# Patient Record
Sex: Male | Born: 1950
Health system: Southern US, Community
[De-identification: ages and names within clinical notes are randomized; demographics above are authoritative.]

## PROBLEM LIST (undated history)

## (undated) DIAGNOSIS — E7800 Pure hypercholesterolemia, unspecified: Secondary | ICD-10-CM

## (undated) DIAGNOSIS — J189 Pneumonia, unspecified organism: Secondary | ICD-10-CM

## (undated) DIAGNOSIS — C801 Malignant (primary) neoplasm, unspecified: Secondary | ICD-10-CM

## (undated) DIAGNOSIS — K5792 Diverticulitis of intestine, part unspecified, without perforation or abscess without bleeding: Secondary | ICD-10-CM

## (undated) DIAGNOSIS — C61 Malignant neoplasm of prostate: Secondary | ICD-10-CM

## (undated) DIAGNOSIS — J61 Pneumoconiosis due to asbestos and other mineral fibers: Secondary | ICD-10-CM

## (undated) DIAGNOSIS — G459 Transient cerebral ischemic attack, unspecified: Secondary | ICD-10-CM

## (undated) DIAGNOSIS — I4891 Unspecified atrial fibrillation: Secondary | ICD-10-CM

## (undated) DIAGNOSIS — H538 Other visual disturbances: Secondary | ICD-10-CM

## (undated) DIAGNOSIS — R972 Elevated prostate specific antigen [PSA]: Secondary | ICD-10-CM

## (undated) DIAGNOSIS — H547 Unspecified visual loss: Secondary | ICD-10-CM

## (undated) DIAGNOSIS — F329 Major depressive disorder, single episode, unspecified: Secondary | ICD-10-CM

## (undated) DIAGNOSIS — Z8739 Personal history of other diseases of the musculoskeletal system and connective tissue: Secondary | ICD-10-CM

## (undated) DIAGNOSIS — R002 Palpitations: Secondary | ICD-10-CM

## (undated) DIAGNOSIS — K219 Gastro-esophageal reflux disease without esophagitis: Secondary | ICD-10-CM

## (undated) DIAGNOSIS — R21 Rash and other nonspecific skin eruption: Secondary | ICD-10-CM

## (undated) DIAGNOSIS — D229 Melanocytic nevi, unspecified: Secondary | ICD-10-CM

## (undated) DIAGNOSIS — K5732 Diverticulitis of large intestine without perforation or abscess without bleeding: Secondary | ICD-10-CM

## (undated) DIAGNOSIS — K635 Polyp of colon: Secondary | ICD-10-CM

## (undated) DIAGNOSIS — M5090 Cervical disc disorder, unspecified, unspecified cervical region: Secondary | ICD-10-CM

## (undated) HISTORY — DX: Polyp of colon: K63.5

## (undated) HISTORY — DX: Diverticulitis of large intestine without perforation or abscess without bleeding: K57.32

## (undated) HISTORY — DX: Elevated prostate specific antigen (PSA): R97.20

## (undated) HISTORY — DX: Other visual disturbances: H53.8

## (undated) HISTORY — PX: HERNIA REPAIR: SHX51

## (undated) HISTORY — DX: Transient cerebral ischemic attack, unspecified: G45.9

## (undated) HISTORY — DX: Malignant neoplasm of prostate: C61

## (undated) HISTORY — DX: Diverticulitis of intestine, part unspecified, without perforation or abscess without bleeding: K57.92

## (undated) HISTORY — DX: Pure hypercholesterolemia, unspecified: E78.00

## (undated) HISTORY — DX: Palpitations: R00.2

## (undated) HISTORY — DX: Major depressive disorder, single episode, unspecified: F32.9

## (undated) HISTORY — DX: Unspecified atrial fibrillation: I48.91

## (undated) HISTORY — DX: Cervical disc disorder, unspecified, unspecified cervical region: M50.90

## (undated) HISTORY — DX: Gastro-esophageal reflux disease without esophagitis: K21.9

## (undated) HISTORY — DX: Rash and other nonspecific skin eruption: R21

## (undated) HISTORY — DX: Personal history of other diseases of the musculoskeletal system and connective tissue: Z87.39

## (undated) HISTORY — DX: Unspecified visual loss: H54.7

## (undated) HISTORY — DX: Pneumoconiosis due to asbestos and other mineral fibers: J61

## (undated) HISTORY — PX: OTHER SURGICAL HISTORY: SHX169

## (undated) HISTORY — PX: PROSTATE BIOPSY: SHX241

---
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
## 1995-09-07 DIAGNOSIS — J189 Pneumonia, unspecified organism: Secondary | ICD-10-CM

## 1995-09-07 HISTORY — DX: Pneumonia, unspecified organism: J18.9

## 2000-09-06 DIAGNOSIS — F32A Depression, unspecified: Secondary | ICD-10-CM

## 2000-09-06 HISTORY — DX: Depression, unspecified: F32.A

## 2002-09-04 ENCOUNTER — Ambulatory Visit (HOSPITAL_COMMUNITY): Admission: RE | Admit: 2002-09-04 | Discharge: 2002-09-04 | Payer: Self-pay | Admitting: Cardiology

## 2005-10-29 ENCOUNTER — Ambulatory Visit (HOSPITAL_COMMUNITY): Admission: RE | Admit: 2005-10-29 | Discharge: 2005-10-29 | Payer: Self-pay | Admitting: Internal Medicine

## 2005-10-29 ENCOUNTER — Ambulatory Visit: Payer: Self-pay | Admitting: Internal Medicine

## 2011-06-22 ENCOUNTER — Telehealth (INDEPENDENT_AMBULATORY_CARE_PROVIDER_SITE_OTHER): Payer: Self-pay | Admitting: *Deleted

## 2011-06-22 ENCOUNTER — Other Ambulatory Visit (INDEPENDENT_AMBULATORY_CARE_PROVIDER_SITE_OTHER): Payer: Self-pay | Admitting: *Deleted

## 2011-06-22 ENCOUNTER — Encounter (INDEPENDENT_AMBULATORY_CARE_PROVIDER_SITE_OTHER): Payer: Self-pay | Admitting: *Deleted

## 2011-06-22 ENCOUNTER — Encounter (INDEPENDENT_AMBULATORY_CARE_PROVIDER_SITE_OTHER): Payer: Self-pay | Admitting: Internal Medicine

## 2011-06-22 ENCOUNTER — Ambulatory Visit (INDEPENDENT_AMBULATORY_CARE_PROVIDER_SITE_OTHER): Payer: 59 | Admitting: Internal Medicine

## 2011-06-22 DIAGNOSIS — Z800 Family history of malignant neoplasm of digestive organs: Secondary | ICD-10-CM

## 2011-06-22 DIAGNOSIS — Z86010 Personal history of colonic polyps: Secondary | ICD-10-CM

## 2011-06-22 DIAGNOSIS — K5792 Diverticulitis of intestine, part unspecified, without perforation or abscess without bleeding: Secondary | ICD-10-CM

## 2011-06-22 DIAGNOSIS — Z1211 Encounter for screening for malignant neoplasm of colon: Secondary | ICD-10-CM

## 2011-06-22 DIAGNOSIS — K5732 Diverticulitis of large intestine without perforation or abscess without bleeding: Secondary | ICD-10-CM

## 2011-06-22 NOTE — Telephone Encounter (Signed)
Patient need movi prep 

## 2011-06-22 NOTE — Progress Notes (Signed)
Subjective:     Patient ID: Blake Moss, male   DOB: November 03, 1950, 60 y.o.   MRN: [id number]  HPI [name] is a 60 yrs old male here today for surveillance colonoscopy.   His last colonoscopy was in 10/29/2005.  Distal proctitis which would explain the patient's intermittent hematochezia and rectal discomfort.  Biopsy: inflamed focally adenomatous polyp.  He has undergone 3 other colonoscopies in the pat, all of which were performed by Dr. Clearence Cheek of [place], [place]. Polyps were found on the first exam, but the subsequent two exams were negative. He tell me he is just getting over a bout of diverticulitis.  He said his symptoms started 5 weeks a go.  He was running a fever and had left lower quadrant pain.  Even with Motrin he could not get his fever down to 100.4. Seen by PCP 4 weeks and was put on Cipro and Flagyl for 10 days.  He does feel better but he has not got his strength back.  This was his second bout of diverticulitis. Appetite is good. No weight loss.  No abdominal pain. No melena or bright red rectal bleeding.   There is a family history of colon cancer.   Review of Systems  See hpi     Past Medical History  Diagnosis Date  . Diverticulitis   . GERD (gastroesophageal reflux disease)    Past Surgical History  Procedure Date  . Footl surgery a    as a child  . Hernia repair     left inguinal 4 yrs ago   History reviewed. No pertinent family history. Family Status  Relation Status Death Age  . Mother Deceased     CHF  . Father Deceased     colon cancer at age 59yrs old  . Brother Alive     joint and back problems  . Child Alive     good health   History   Social History Narrative  . No narrative on file   History   Social History  . Marital Status: Married    Spouse Name: N/A    Number of Children: N/A  . Years of Education: N/A   Occupational History  . Not on file.   Social History Main Topics  . Smoking status: Never Smoker   . Smokeless tobacco:  Not on file  . Alcohol Use: Yes     rarely  . Drug Use: No  . Sexually Active: Not on file   Other Topics Concern  . Not on file   Social History Narrative  . No narrative on file   Allergies  Allergen Reactions  . Penicillins     Objective:   Physical Exam  Filed Vitals:   06/22/11 1012  BP: 110/78  Pulse: 72  Temp: 98.6 F (37 C)  Height: 5\' 11"  (1.803 m)  Weight: 165 lb 12.8 oz (75.206 kg)    Alert and oriented. Skin warm and dry. Oral mucosa is moist. Natural teeth in good condition. Sclera anicteric, conjunctivae is pink. Thyroid not enlarged. No cervical lymphadenopathy. Lungs clear. Heart regular rate and rhythm.  Abdomen is soft. Bowel sounds are positive. No hepatomegaly. No abdominal masses felt. No tenderness.  No edema to lower extremities. Patient is alert and oriented.      Assessment:     High risk colonoscopy. Family hx of colon cancer.   Recent hx of Diverticulitis Plan:     Colonoscopy  . The risks and benefits such as perforation,  bleeding, and infection were reviewed with the patient and is agreeable.  After colonoscopy, patient would also liked to be referred to talk with a surgeon concerning his recent bout of diverticulitis.

## 2011-06-22 NOTE — Patient Instructions (Signed)
Will schedule a colonoscopy with Dr. Rehman 

## 2011-06-23 MED ORDER — PEG-KCL-NACL-NASULF-NA ASC-C 100 G PO SOLR: 1.0000 | Freq: Once | ORAL | Status: DC

## 2011-07-13 ENCOUNTER — Encounter (HOSPITAL_COMMUNITY): Payer: Self-pay | Admitting: Pharmacy Technician

## 2011-07-22 MED ORDER — SODIUM CHLORIDE 0.45 % IV SOLN
Freq: Once | INTRAVENOUS | Status: AC
  Administered 2011-07-23: 1000 mL via INTRAVENOUS

## 2011-07-23 ENCOUNTER — Encounter (HOSPITAL_COMMUNITY): Payer: Self-pay | Admitting: *Deleted

## 2011-07-23 ENCOUNTER — Ambulatory Visit (HOSPITAL_COMMUNITY)
Admission: RE | Admit: 2011-07-23 | Discharge: 2011-07-23 | Disposition: A | Discharge status: Home / Self Care | Payer: 59 | Source: Ambulatory Visit | Attending: Internal Medicine | Admitting: Internal Medicine

## 2011-07-23 ENCOUNTER — Encounter (HOSPITAL_COMMUNITY)
Admission: RE | Disposition: A | Discharge status: Home / Self Care | Payer: Self-pay | Source: Ambulatory Visit | Attending: Internal Medicine

## 2011-07-23 DIAGNOSIS — K5730 Diverticulosis of large intestine without perforation or abscess without bleeding: Secondary | ICD-10-CM

## 2011-07-23 DIAGNOSIS — Z800 Family history of malignant neoplasm of digestive organs: Secondary | ICD-10-CM | POA: Insufficient documentation

## 2011-07-23 DIAGNOSIS — Z8719 Personal history of other diseases of the digestive system: Secondary | ICD-10-CM

## 2011-07-23 DIAGNOSIS — K644 Residual hemorrhoidal skin tags: Secondary | ICD-10-CM

## 2011-07-23 DIAGNOSIS — Z09 Encounter for follow-up examination after completed treatment for conditions other than malignant neoplasm: Secondary | ICD-10-CM

## 2011-07-23 DIAGNOSIS — Z860100 Personal history of colon polyps, unspecified: Secondary | ICD-10-CM | POA: Insufficient documentation

## 2011-07-23 DIAGNOSIS — R1031 Right lower quadrant pain: Secondary | ICD-10-CM | POA: Insufficient documentation

## 2011-07-23 HISTORY — PX: COLONOSCOPY: SHX5424

## 2011-07-23 SURGERY — COLONOSCOPY
Anesthesia: Moderate Sedation

## 2011-07-23 MED ORDER — MIDAZOLAM HCL 5 MG/5ML IJ SOLN
INTRAMUSCULAR | Status: AC
  Filled 2011-07-23: qty 10

## 2011-07-23 MED ORDER — STERILE WATER FOR IRRIGATION IR SOLN
Status: DC | PRN
  Administered 2011-07-23: 08:00:00

## 2011-07-23 MED ORDER — MEPERIDINE HCL 50 MG/ML IJ SOLN
INTRAMUSCULAR | Status: DC | PRN
  Administered 2011-07-23 (×2): 25 mg via INTRAVENOUS

## 2011-07-23 MED ORDER — MIDAZOLAM HCL 5 MG/5ML IJ SOLN
INTRAMUSCULAR | Status: DC | PRN
  Administered 2011-07-23 (×3): 2 mg via INTRAVENOUS

## 2011-07-23 MED ORDER — HYOSCYAMINE SULFATE 0.125 MG SL SUBL: 0.1250 mg | SUBLINGUAL_TABLET | Freq: Three times a day (TID) | SUBLINGUAL | Status: AC | PRN

## 2011-07-23 MED ORDER — MEPERIDINE HCL 50 MG/ML IJ SOLN
INTRAMUSCULAR | Status: AC
  Filled 2011-07-23: qty 1

## 2011-07-23 NOTE — H&P (Signed)
Blake Moss is an 60 y.o. male.   Chief Complaint: Patient is here for colonoscopy. HPI: This 60 year old Caucasian male with history of colonic polyps. She's undergone multiple colonoscopies in the past. His last exam was over 5 years ago. Polyps removed in 3 colonoscopies. He is complaining of pain in right lower quadrant. About 2 months ago he was treated for diverticulitis when he had fever and pain in left lower quadrant. He noted stools are dark but not quite black when he was treated with antibiotics. Family history is positive for colon  carcinoma in his father and maternal grandmother. Father was diagnosed at age 8 and died within a year grandmother died in her 21s. Past Medical History  Diagnosis Date  . Diverticulitis   . GERD (gastroesophageal reflux disease)     Past Surgical History  Procedure Date  . Footl surgery a    as a child  . Hernia repair     left inguinal 4 yrs ago    Family History  Problem Relation Age of Onset  . Coronary artery disease Mother   . Colon cancer Father   . Lupus Father    Social History:  reports that he has never smoked. He does not have any smokeless tobacco history on file. He reports that he drinks alcohol. He reports that he does not use illicit drugs.  Allergies:  Allergies  Allergen Reactions  . Penicillins     Medications Prior to Admission  Medication Dose Route Frequency Provider Last Rate Last Dose  . 0.45 % sodium chloride infusion   Intravenous Once Malissa Hippo, MD 20 mL/hr at 07/23/11 0722 1,000 mL at 07/23/11 0722  . meperidine (DEMEROL) 50 MG/ML injection           . midazolam (VERSED) 5 MG/5ML injection            Medications Prior to Admission  Medication Sig Dispense Refill  . peg 3350 powder (MOVIPREP) 100 G SOLR Take 1 kit (100 g total) by mouth once.  1 kit  0    No results found for this or any previous visit (from the past 48 hour(s)). No results found.  Review of Systems  Constitutional:  Negative for weight loss.  Gastrointestinal: Negative for diarrhea, constipation, blood in stool and melena. Abdominal pain: intermittent pain in right lower quadrant.    Blood pressure 136/99, pulse 95, temperature 97.8 F (36.6 C), temperature source Oral, resp. rate 20, height 5\' 11"  (1.803 m), weight 165 lb (74.844 kg), SpO2 92.00%. Physical Exam  Constitutional: He appears well-developed and well-nourished.  HENT:  Mouth/Throat: Oropharynx is clear and moist.  Eyes: Conjunctivae are normal. No scleral icterus.  Neck: No thyromegaly present.  Cardiovascular: Normal rate, regular rhythm and normal heart sounds.   No murmur heard. Respiratory: Effort normal and breath sounds normal.  GI: Soft. He exhibits no distension and no mass. There is no tenderness.  Musculoskeletal: He exhibits no edema.  Lymphadenopathy:    He has no cervical adenopathy.  Neurological: He is alert.  Skin: Skin is warm and dry.     Assessment/Plan History of colonic polyps. Family history of colon carcinoma. Surveillance colonoscopy  Deiona Hooper U 07/23/2011, 7:38 AM

## 2011-07-23 NOTE — Op Note (Signed)
COLONOSCOPY PROCEDURE REPORT  PATIENT:  Blake Moss  MR#:  [id number] Birthdate:  1950-12-26, 60 y.o., male Endoscopist:  Dr. Malissa Hippo, MD Referred By:  Dr. Kirstie Peri, M.D. Procedure Date: 07/23/2011  Procedure:   Colonoscopy  Indications:  Patient is 60 year old Caucasian male with history of colonic polyps family history of colon carcinoma to about 8 weeks ago he was treated for was felt to be diverticulitis. Left-sided abdominal pain has resolved but he still has pain at RLQ.  Informed Consent:  Seizure and risks were reviewed with the patient and informed consent was obtained.  Medications:  Demerol 50 mg IV Versed 6 mg IV  Description of procedure:  After a digital rectal exam was performed, that colonoscope was advanced from the anus through the rectum and colon to the area of the cecum, ileocecal valve and appendiceal orifice. The cecum was deeply intubated. These structures were well-seen and photographed for the record. From the level of the cecum and ileocecal valve, the scope was slowly and cautiously withdrawn. The mucosal surfaces were carefully surveyed utilizing scope tip to flexion to facilitate fold flattening as needed. The scope was pulled down into the rectum where a thorough exam including retroflexion was performed.  Findings:   Prep excellent. Moderate number of diverticula at sigmoid colon. No polyps or other mucosal abnormalities noted. Hemorrhoids below the dentate line.  Therapeutic/Diagnostic Maneuvers Performed:  None  Complications:  None  Cecal Withdrawal Time:  11 minutes  Impression:  Examination performed to cecum. No evidence of recurrent polyps or colitis. Moderate sigmoid diverticulosis. Small external hemorrhoids.  Recommendations:  Standard instructions given. Levsin sublingual one tablet 3 times a day as needed for abdominal pain. Next colonoscopy in 5 years.   REHMAN,NAJEEB U  07/23/2011 8:07 AM  CC: Dr. Kirstie Peri,  MD & Dr. Bonnetta Barry ref. provider found

## 2011-08-02 ENCOUNTER — Encounter (HOSPITAL_COMMUNITY): Payer: Self-pay | Admitting: Internal Medicine

## 2012-02-04 ENCOUNTER — Encounter (INDEPENDENT_AMBULATORY_CARE_PROVIDER_SITE_OTHER): Payer: Self-pay

## 2013-03-19 DIAGNOSIS — D229 Melanocytic nevi, unspecified: Secondary | ICD-10-CM

## 2013-03-19 HISTORY — DX: Melanocytic nevi, unspecified: D22.9

## 2015-10-29 ENCOUNTER — Other Ambulatory Visit: Payer: Self-pay | Admitting: Urology

## 2015-11-14 NOTE — Patient Instructions (Addendum)
Blake Moss  11/14/2015   Your procedure is scheduled on: Monday 11/24/2015  Report to [facility] Main  Entrance take [place]  elevators to 3rd floor to  [place] at  0930 AM.  Call this number if you have problems the morning of surgery [phone]   Remember: ONLY 1 PERSON MAY GO WITH YOU TO SHORT STAY TO GET  READY MORNING OF [place].   Do not eat food or drink liquids :After Midnight on Saturday 11/22/2015!               FOLLOW BOWEL PREP INSTRUCTIONS FROM DR. BORDEN'S OFFICE ON Sunday 11/23/2015 AND FOLLOW A CLEAR LIQUID DIET ON THAT DAY UP TIL MIDNIGHT!   CLEAR LIQUID DIET   Foods Allowed                                                                     Foods Excluded  Coffee and tea, regular and decaf                             liquids that you cannot  Plain Jell-O in any flavor                                             see through such as: Fruit ices (not with fruit pulp)                                     milk, soups, orange juice  Iced Popsicles                                    All solid food Carbonated beverages, regular and diet                                    Cranberry, grape and apple juices Sports drinks like Gatorade Lightly seasoned clear broth or consume(fat free) Sugar, honey syrup  Sample Menu Breakfast                                Lunch                                     Supper Cranberry juice                    Beef broth                            Chicken broth Jell-O  Grape juice                           Apple juice Coffee or tea                        Jell-O                                      Popsicle                                                Coffee or tea                        Coffee or tea  _____________________________________________________________________     Take these medicines the morning of surgery with A SIP OF WATER: Omeprazole                        You may not have any metal on your body including hair pins and              piercings  Do not wear jewelry, make-up, lotions, powders or perfumes, deodorant             Do not wear nail polish.  Do not shave  48 hours prior to surgery.              Men may shave face and neck.   Do not bring valuables to the hospital. [place].  Contacts, dentures or bridgework may not be worn into surgery.  Leave suitcase in the car. After surgery it may be brought to your room.      Please read over the following fact sheets you were given: _____________________________________________________________________             [facility] - Preparing for Surgery Before surgery, you can play an important role.  Because skin is not sterile, your skin needs to be as free of germs as possible.  You can reduce the number of germs on your skin by washing with CHG (chlorahexidine gluconate) soap before surgery.  CHG is an antiseptic cleaner which kills germs and bonds with the skin to continue killing germs even after washing. Please DO NOT use if you have an allergy to CHG or antibacterial soaps.  If your skin becomes reddened/irritated stop using the CHG and inform your nurse when you arrive at Short Stay. Do not shave (including legs and underarms) for at least 48 hours prior to the first CHG shower.  You may shave your face/neck. Please follow these instructions carefully:  1.  Shower with CHG Soap the night before surgery and the  morning of Surgery.  2.  If you choose to wash your hair, wash your hair first as usual with your  normal  shampoo.  3.  After you shampoo, rinse your hair and body thoroughly to remove the  shampoo.                           4.  Use CHG as you would any other liquid soap.  You can apply chg directly  to the skin and wash                       Gently with a scrungie or clean washcloth.  5.  Apply the CHG Soap to  your body ONLY FROM THE NECK DOWN.   Do not use on face/ open                           Wound or open sores. Avoid contact with eyes, ears mouth and genitals (private parts).                       Wash face,  Genitals (private parts) with your normal soap.             6.  Wash thoroughly, paying special attention to the area where your surgery  will be performed.  7.  Thoroughly rinse your body with warm water from the neck down.  8.  DO NOT shower/wash with your normal soap after using and rinsing off  the CHG Soap.                9.  Pat yourself dry with a clean towel.            10.  Wear clean pajamas.            11.  Place clean sheets on your bed the night of your first shower and do not  sleep with pets. Day of Surgery : Do not apply any lotions/deodorants the morning of surgery.  Please wear clean clothes to the hospital/surgery center.  FAILURE TO FOLLOW THESE INSTRUCTIONS MAY RESULT IN THE CANCELLATION OF YOUR SURGERY PATIENT SIGNATURE_________________________________  NURSE SIGNATURE__________________________________  ________________________________________________________________________   Blake Moss  An incentive spirometer is a tool that can help keep your lungs clear and active. This tool measures how well you are filling your lungs with each breath. Taking long deep breaths may help reverse or decrease the chance of developing breathing (pulmonary) problems (especially infection) following:  A long period of time when you are unable to move or be active. BEFORE THE PROCEDURE   If the spirometer includes an indicator to show your best effort, your nurse or respiratory therapist will set it to a desired goal.  If possible, sit up straight or lean slightly forward. Try not to slouch.  Hold the incentive spirometer in an upright position. INSTRUCTIONS FOR USE   Sit on the edge of your bed if possible, or sit up as far as you can in bed or on a chair.  Hold  the incentive spirometer in an upright position.  Breathe out normally.  Place the mouthpiece in your mouth and seal your lips tightly around it.  Breathe in slowly and as deeply as possible, raising the piston or the ball toward the top of the column.  Hold your breath for 3-5 seconds or for as long as possible. Allow the piston or ball to fall to the bottom of the column.  Remove the mouthpiece from your mouth and breathe out normally.  Rest for a few seconds and repeat Steps 1 through 7 at least 10 times every 1-2 hours when you are awake. Take your time and take a few normal breaths between deep breaths.  The spirometer may include an indicator to show your  best effort. Use the indicator as a goal to work toward during each repetition.  After each set of 10 deep breaths, practice coughing to be sure your lungs are clear. If you have an incision (the cut made at the time of surgery), support your incision when coughing by placing a pillow or rolled up towels firmly against it. Once you are able to get out of bed, walk around indoors and cough well. You may stop using the incentive spirometer when instructed by your caregiver.  RISKS AND COMPLICATIONS  Take your time so you do not get dizzy or light-headed.  If you are in pain, you may need to take or ask for pain medication before doing incentive spirometry. It is harder to take a deep breath if you are having pain. AFTER USE  Rest and breathe slowly and easily.  It can be helpful to keep track of a log of your progress. Your caregiver can provide you with a simple table to help with this. If you are using the spirometer at home, follow these instructions: [place] IF:   You are having difficultly using the spirometer.  You have trouble using the spirometer as often as instructed.  Your pain medication is not giving enough relief while using the spirometer.  You develop fever of 100.5 F (38.1 C) or higher. SEEK  IMMEDIATE MEDICAL CARE IF:   You cough up bloody sputum that had not been present before.  You develop fever of 102 F (38.9 C) or greater.  You develop worsening pain at or near the incision site. MAKE SURE YOU:   Understand these instructions.  Will watch your condition.  Will get help right away if you are not doing well or get worse. Document Released: 01/03/2007 Document Revised: 11/15/2011 Document Reviewed: 03/06/2007 ExitCare Patient Information 2014 ExitCare, [place].   ________________________________________________________________________  WHAT IS A BLOOD TRANSFUSION? Blood Transfusion Information  A transfusion is the replacement of blood or some of its parts. Blood is made up of multiple cells which provide different functions.  Red blood cells carry oxygen and are used for blood loss replacement.  White blood cells fight against infection.  Platelets control bleeding.  Plasma helps clot blood.  Other blood products are available for specialized needs, such as hemophilia or other clotting disorders. BEFORE THE TRANSFUSION  Who gives blood for transfusions?   Healthy volunteers who are fully evaluated to make sure their blood is safe. This is blood bank blood. Transfusion therapy is the safest it has ever been in the practice of medicine. Before blood is taken from a donor, a complete history is taken to make sure that person has no history of diseases nor engages in risky social behavior (examples are intravenous drug use or sexual activity with multiple partners). The donor's travel history is screened to minimize risk of transmitting infections, such as malaria. The donated blood is tested for signs of infectious diseases, such as HIV and hepatitis. The blood is then tested to be sure it is compatible with you in order to minimize the chance of a transfusion reaction. If you or a relative donates blood, this is often done in anticipation of surgery and is not  appropriate for emergency situations. It takes many days to process the donated blood. RISKS AND COMPLICATIONS Although transfusion therapy is very safe and saves many lives, the main dangers of transfusion include:   Getting an infectious disease.  Developing a transfusion reaction. This is an allergic reaction to something  in the blood you were given. Every precaution is taken to prevent this. The decision to have a blood transfusion has been considered carefully by your caregiver before blood is given. Blood is not given unless the benefits outweigh the risks. AFTER THE TRANSFUSION  Right after receiving a blood transfusion, you will usually feel much better and more energetic. This is especially true if your red blood cells have gotten low (anemic). The transfusion raises the level of the red blood cells which carry oxygen, and this usually causes an energy increase.  The nurse administering the transfusion will monitor you carefully for complications. HOME CARE INSTRUCTIONS  No special instructions are needed after a transfusion. You may find your energy is better. Speak with your caregiver about any limitations on activity for underlying diseases you may have. SEEK MEDICAL CARE IF:   Your condition is not improving after your transfusion.  You develop redness or irritation at the intravenous (IV) site. SEEK IMMEDIATE MEDICAL CARE IF:  Any of the following symptoms occur over the next 12 hours:  Shaking chills.  You have a temperature by mouth above 102 F (38.9 C), not controlled by medicine.  Chest, back, or muscle pain.  People around you feel you are not acting correctly or are confused.  Shortness of breath or difficulty breathing.  Dizziness and fainting.  You get a rash or develop hives.  You have a decrease in urine output.  Your urine turns a dark color or changes to pink, red, or brown. Any of the following symptoms occur over the next 10 days:  You have a  temperature by mouth above 102 F (38.9 C), not controlled by medicine.  Shortness of breath.  Weakness after normal activity.  The white part of the eye turns yellow (jaundice).  You have a decrease in the amount of urine or are urinating less often.  Your urine turns a dark color or changes to pink, red, or brown. Document Released: 08/20/2000 Document Revised: 11/15/2011 Document Reviewed: 04/08/2008 [facility] Patient Information 2014 [place], [place].  _______________________________________________________________________

## 2015-11-17 ENCOUNTER — Ambulatory Visit (HOSPITAL_COMMUNITY)
Admission: RE | Admit: 2015-11-17 | Discharge: 2015-11-17 | Disposition: A | Discharge status: Home / Self Care | Payer: 59 | Source: Ambulatory Visit | Attending: Anesthesiology | Admitting: Anesthesiology

## 2015-11-17 ENCOUNTER — Encounter (HOSPITAL_COMMUNITY): Payer: Self-pay

## 2015-11-17 ENCOUNTER — Encounter (HOSPITAL_COMMUNITY)
Admission: RE | Admit: 2015-11-17 | Discharge: 2015-11-17 | Disposition: A | Discharge status: Home / Self Care | Payer: 59 | Source: Ambulatory Visit | Attending: Urology | Admitting: Urology

## 2015-11-17 DIAGNOSIS — Z8546 Personal history of malignant neoplasm of prostate: Secondary | ICD-10-CM | POA: Diagnosis present

## 2015-11-17 DIAGNOSIS — Z01818 Encounter for other preprocedural examination: Secondary | ICD-10-CM | POA: Diagnosis not present

## 2015-11-17 HISTORY — DX: Pneumonia, unspecified organism: J18.9

## 2015-11-17 HISTORY — DX: Malignant (primary) neoplasm, unspecified: C80.1

## 2015-11-17 LAB — CBC
HCT: 48.3 % (ref 39.0–52.0)
Hemoglobin: 16.1 g/dL (ref 13.0–17.0)
MCH: 31.7 pg (ref 26.0–34.0)
MCHC: 33.3 g/dL (ref 30.0–36.0)
MCV: 95.1 fL (ref 78.0–100.0)
PLATELETS: 233 10*3/uL (ref 150–400)
RBC: 5.08 MIL/uL (ref 4.22–5.81)
RDW: 14.3 % (ref 11.5–15.5)
WBC: 5.9 10*3/uL (ref 4.0–10.5)

## 2015-11-17 LAB — BASIC METABOLIC PANEL
Anion gap: 8 (ref 5–15)
BUN: 13 mg/dL (ref 6–20)
CALCIUM: 9.6 mg/dL (ref 8.9–10.3)
CO2: 24 mmol/L (ref 22–32)
CREATININE: 0.9 mg/dL (ref 0.61–1.24)
Chloride: 108 mmol/L (ref 101–111)
GFR calc non Af Amer: >60 mL/min (ref >60)
Glucose, Bld: 112 mg/dL — ABNORMAL HIGH (ref 65–99)
Potassium: 4.3 mmol/L (ref 3.5–5.1)
SODIUM: 140 mmol/L (ref 135–145)

## 2015-11-17 LAB — ABO/RH: ABO/RH(D): A NEG

## 2015-11-17 NOTE — Progress Notes (Addendum)
10/27/2015-Last office note from Dr. Monico Blitz 08/16/2002-@ D ECHO from [facility] on chart. 08/15/2012-2 view chest from [place], [place] on chart .04/24/2006-EKG on chart from [facility] on chart.

## 2015-11-18 NOTE — Progress Notes (Signed)
Consulted Anesthesia, Dr. Suella Broad about EKG . Dr. Smith Robert wants to look at most recent Echocardiogram. PCP office called for Echo results.

## 2015-11-18 NOTE — Progress Notes (Addendum)
09/22/2015-Echo from [facility] on chart.Marland Kitchen Consulted Anesthesia, Dr. Suella Broad about results of Echo . Per Dr. Smith Robert, patient is OK for surgery.

## 2015-11-21 NOTE — H&P (Signed)
Chief Complaint Prostate Cancer   Reason For Visit Reason for consult: To discuss treatment options for prostate cancer.  Physician requesting consult: Dr. Clyde Lundborg  PCP: Dr. Monico Blitz   History of Present Illness Blake Moss is a 66 year old gentleman who was noted to have an elevated PSA of 10.0. He has a history of prostatitis and although he did not have any clinical symptoms to suggest acute prostatitis, he was empirically treated with ciprofloxacin for 3 weeks. A repeat PSA remained elevated at 14.2. This ultimately prompted a TRUS biopsy of the prostate by Dr. Exie Parody on 09/30/15 that revealed Gleason 4+4=8 adenocarcinoma of the prostate. Staging studies were performed including a bone scan (10/16/15) that demonstrated mild uptake at the right cervical spine, bilateral knees, and the left ankle felt to be most consistent with degenerative changes and a CT scan of the pelvis (10/14/15) that did not demonstrated lymphadenopathy or other findings to suggest metastatic disease. He has maternal grandfather with a history of prostate cancer.    ** His PMH is significant for GERD and an open inguinal hernia repair.    TNM stage: cT1c N0 M0  PSA: 14.2  Gleason score: 4+4=8  Biopsy (09/30/15- read by Dr. Mark Martinique, Acc# [phone]): Left apex (40% of 1 core, 3+3 = 6), left mid (90% of 1 core, 4+4=8), left base (10% of 1 core, 4+4 = 8), right base (60% of one core and 75% of another core, 4+3 = 7)  Prostate volume: 20.2 cc      Nomogram  OC disease: 14%  EPE: 83%  SVI: 35%  LNI: 36%  PFS (surgery): 26% at 5 years, 16% at 10 years    Urinary function: He has mild lower urinary tract symptoms. IPSS is 5. The symptoms are not bothersome for him.  Erectile function: SHIM score is 1. He is not sexually active and erectile function as a low priority considering his wife's health issues related to Crohn's disease.   Past Medical History Problems  1. History of esophageal  reflux (Z87.19)  Surgical History Problems  1. History of Hernia Repair  Current Meds 1. No Reported Medications Recorded  Allergies Medication  1. Penicillins  Family History Problems  1. Family history of colon cancer (Z80.0) : Father 2. Family history of prostate cancer (Z80.42) : Maternal Grandfather 3. Family history of Urinary calculus : Mother  Social History Problems    Denied: History of Alcohol use   Married   Never a smoker  Review of Systems Genitourinary, constitutional, skin, eye, otolaryngeal, hematologic/lymphatic, cardiovascular, pulmonary, endocrine, musculoskeletal, gastrointestinal, neurological and psychiatric system(s) were reviewed and pertinent findings if present are noted and are otherwise negative.  Gastrointestinal: heartburn.  Integumentary: skin rash/lesion and pruritus.  Eyes: blurred vision.  Respiratory: cough.  Musculoskeletal: joint pain.    Vitals Vital Signs [Data Includes: Last 1 Day]  Recorded: 21Feb2017 09:00AM  Blood Pressure: 138 / 92 Heart Rate: 84  Physical Exam Constitutional: Well nourished and well developed . No acute distress.  ENT:. The ears and nose are normal in appearance.  Neck: The appearance of the neck is normal and no neck mass is present.  Pulmonary: No respiratory distress, normal respiratory rhythm and effort and clear bilateral breath sounds.  Cardiovascular: Heart rate and rhythm are normal . No peripheral edema.  Abdomen: The abdomen is soft and nontender. No masses are palpated. No CVA tenderness. No hernias are palpable. No hepatosplenomegaly noted. 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  Rectal: Prostate size is estimated to be 25 g. He does have some mild induration toward the right lateral mid prostate gland. It is difficult to tell if this is related to his recent biopsy. No definite extraprostatic  disease is noted.  Lymphatics: The femoral and inguinal nodes are not enlarged or tender.  Skin: Normal skin turgor, no visible rash and no visible skin lesions.  Neuro/Psych:. Mood and affect are appropriate.    Results/Data I have independently reviewed his medical records, PSA results, and pathology report. I independently reviewed his bone scan and CT scan. Findings are as dictated above.     Assessment Assessed  1. Prostate cancer (C61)  Plan Prostate cancer  1. Follow-up Office  Follow-up  Status: Hold For - Date of Service  Requested for:  21Feb2017 2. PT/OT Referral Referral  Referral  Status: Hold For - PreCert,Date of Service,Physical  Therapy  Requested for: [id number]  Discussion/Summary 1. High risk localized prostate cancer: I had a detailed discussion with Blake Moss. He is very well informed about his treatment options. He does wish to proceed with therapy of curative intent and after reviewing options, he is most interested in a surgical approach. He does understand the potential risks for necessitating multimodality therapy considering the high risk nature of his disease.   The patient was counseled about the natural history of prostate cancer and the standard treatment options that are available for prostate cancer. It was explained to him how his age and life expectancy, clinical stage, Gleason score, and PSA affect his prognosis, the decision to proceed with additional staging studies, as well as how that information influences recommended treatment strategies. We discussed the roles for active surveillance, radiation therapy, surgical therapy, androgen deprivation, as well as ablative therapy options for the treatment of prostate cancer as appropriate to his individual cancer situation. We discussed the risks and benefits of these options with regard to their impact on cancer control and also in terms of potential adverse events, complications, and impact on quiality of  life particularly related to urinary, bowel, and sexual function. The patient was encouraged to ask questions throughout the discussion today and all questions were answered to his stated satisfaction. In addition, the patient was provided with and/or directed to appropriate resources and literature for further education about prostate cancer and treatment options.   We discussed surgical therapy for prostate cancer including the different available surgical approaches. We discussed, in detail, the risks and expectations of surgery with regard to cancer control, urinary control, and erectile function as well as the expected postoperative recovery process. Additional risks of surgery including but not limited to bleeding, infection, hernia formation, nerve damage, lymphocele formation, bowel/rectal injury potentially necessitating colostomy, damage to the urinary tract resulting in urine leakage, urethral stricture, and the cardiopulmonary risks such as myocardial infarction, stroke, death, venothromboembolism, etc. were explained. The risk of open surgical conversion for robotic/laparoscopic prostatectomy was also discussed.     He will be scheduled for a robot-assisted laparoscopic radical prostatectomy and bilateral pelvic lymphadenectomy. Although he does not necessarily require a wide excision of the prostate, he understands that we will not make an attempt for an aggressive nerve sparing procedure either considering the fact that he places sexual function as a very low priority especially in the context of his high risk.    Cc: Dr. Clyde Lundborg  Dr. Monico Blitz  A total of 65 minutes were spent in the overall care of the patient today with 55 minutes in direct face to face consultation.    Signatures Electronically signed by : Raynelle Bring, M.D.; Oct 28 2015 11:21AM EST

## 2015-11-24 ENCOUNTER — Inpatient Hospital Stay (HOSPITAL_COMMUNITY): Payer: 59 | Admitting: Certified Registered"

## 2015-11-24 ENCOUNTER — Inpatient Hospital Stay (HOSPITAL_COMMUNITY)
Admission: RE | Admit: 2015-11-24 | Discharge: 2015-11-25 | DRG: 708 | Disposition: A | Discharge status: Home / Self Care | Payer: 59 | Source: Ambulatory Visit | Attending: Urology | Admitting: Urology

## 2015-11-24 ENCOUNTER — Encounter (HOSPITAL_COMMUNITY)
Admission: RE | Disposition: A | Discharge status: Home / Self Care | Payer: Self-pay | Source: Ambulatory Visit | Attending: Urology

## 2015-11-24 ENCOUNTER — Encounter (HOSPITAL_COMMUNITY): Payer: Self-pay | Admitting: *Deleted

## 2015-11-24 DIAGNOSIS — C61 Malignant neoplasm of prostate: Principal | ICD-10-CM | POA: Diagnosis present

## 2015-11-24 DIAGNOSIS — Z800 Family history of malignant neoplasm of digestive organs: Secondary | ICD-10-CM

## 2015-11-24 DIAGNOSIS — K219 Gastro-esophageal reflux disease without esophagitis: Secondary | ICD-10-CM | POA: Diagnosis present

## 2015-11-24 DIAGNOSIS — Z8042 Family history of malignant neoplasm of prostate: Secondary | ICD-10-CM | POA: Diagnosis not present

## 2015-11-24 HISTORY — PX: ROBOT ASSISTED LAPAROSCOPIC RADICAL PROSTATECTOMY: SHX5141

## 2015-11-24 HISTORY — PX: LYMPHADENECTOMY: SHX5960

## 2015-11-24 LAB — TYPE AND SCREEN
ABO/RH(D): A NEG
Antibody Screen: NEGATIVE

## 2015-11-24 LAB — HEMOGLOBIN AND HEMATOCRIT, BLOOD
HCT: 43.4 % (ref 39.0–52.0)
Hemoglobin: 14.5 g/dL (ref 13.0–17.0)

## 2015-11-24 SURGERY — XI ROBOTIC ASSISTED LAPAROSCOPIC RADICAL PROSTATECTOMY LEVEL 2
Anesthesia: General

## 2015-11-24 MED ORDER — PHENYLEPHRINE 40 MCG/ML (10ML) SYRINGE FOR IV PUSH (FOR BLOOD PRESSURE SUPPORT)
PREFILLED_SYRINGE | INTRAVENOUS | Status: AC
  Filled 2015-11-24: qty 20

## 2015-11-24 MED ORDER — HYDROCODONE-ACETAMINOPHEN 5-325 MG PO TABS: 1.0000 | ORAL_TABLET | Freq: Four times a day (QID) | ORAL | Status: DC | PRN

## 2015-11-24 MED ORDER — MIDAZOLAM HCL 5 MG/5ML IJ SOLN
INTRAMUSCULAR | Status: DC | PRN
  Administered 2015-11-24: 2 mg via INTRAVENOUS

## 2015-11-24 MED ORDER — LACTATED RINGERS IV SOLN
INTRAVENOUS | Status: DC | PRN
  Administered 2015-11-24 (×3): via INTRAVENOUS

## 2015-11-24 MED ORDER — HYDROMORPHONE HCL 1 MG/ML IJ SOLN
INTRAMUSCULAR | Status: AC
  Filled 2015-11-24: qty 1

## 2015-11-24 MED ORDER — PANTOPRAZOLE SODIUM 40 MG PO TBEC
40.0000 mg | DELAYED_RELEASE_TABLET | Freq: Every day | ORAL | Status: DC
  Administered 2015-11-25: 40 mg via ORAL
  Filled 2015-11-24 (×2): qty 1

## 2015-11-24 MED ORDER — FENTANYL CITRATE (PF) 250 MCG/5ML IJ SOLN
INTRAMUSCULAR | Status: AC
  Filled 2015-11-24: qty 5

## 2015-11-24 MED ORDER — FENTANYL CITRATE (PF) 100 MCG/2ML IJ SOLN
INTRAMUSCULAR | Status: DC | PRN
  Administered 2015-11-24: 50 ug via INTRAVENOUS
  Administered 2015-11-24: 100 ug via INTRAVENOUS
  Administered 2015-11-24 (×2): 50 ug via INTRAVENOUS

## 2015-11-24 MED ORDER — INFLUENZA VAC SPLIT QUAD 0.5 ML IM SUSY
0.5000 mL | PREFILLED_SYRINGE | INTRAMUSCULAR | Status: AC
  Administered 2015-11-25: 0.5 mL via INTRAMUSCULAR
  Filled 2015-11-24 (×2): qty 0.5

## 2015-11-24 MED ORDER — SODIUM CHLORIDE 0.9 % IR SOLN
Status: DC | PRN
  Administered 2015-11-24: 1000 mL

## 2015-11-24 MED ORDER — CEFAZOLIN SODIUM-DEXTROSE 2-3 GM-% IV SOLR
INTRAVENOUS | Status: AC
Start: 2015-11-24 — End: 2015-11-24
  Filled 2015-11-24: qty 50

## 2015-11-24 MED ORDER — DEXAMETHASONE SODIUM PHOSPHATE 10 MG/ML IJ SOLN
INTRAMUSCULAR | Status: AC
  Filled 2015-11-24: qty 1

## 2015-11-24 MED ORDER — MIDAZOLAM HCL 2 MG/2ML IJ SOLN
INTRAMUSCULAR | Status: AC
Start: 2015-11-24 — End: 2015-11-24
  Filled 2015-11-24: qty 2

## 2015-11-24 MED ORDER — HEPARIN SODIUM (PORCINE) 1000 UNIT/ML IJ SOLN
INTRAMUSCULAR | Status: AC
Start: 2015-11-24 — End: 2015-11-24
  Filled 2015-11-24: qty 1

## 2015-11-24 MED ORDER — LACTATED RINGERS IV SOLN
INTRAVENOUS | Status: DC | PRN
  Administered 2015-11-24: 12:00:00

## 2015-11-24 MED ORDER — ROCURONIUM BROMIDE 100 MG/10ML IV SOLN
INTRAVENOUS | Status: DC | PRN
  Administered 2015-11-24: 60 mg via INTRAVENOUS
  Administered 2015-11-24 (×2): 20 mg via INTRAVENOUS

## 2015-11-24 MED ORDER — FENTANYL CITRATE (PF) 100 MCG/2ML IJ SOLN: 25.0000 ug | INTRAMUSCULAR | Status: DC | PRN

## 2015-11-24 MED ORDER — BUPIVACAINE-EPINEPHRINE 0.25% -1:200000 IJ SOLN
INTRAMUSCULAR | Status: DC | PRN
  Administered 2015-11-24: 30 mL

## 2015-11-24 MED ORDER — ONDANSETRON HCL 4 MG/2ML IJ SOLN
4.0000 mg | INTRAMUSCULAR | Status: DC | PRN
  Administered 2015-11-24 – 2015-11-25 (×4): 4 mg via INTRAVENOUS
  Filled 2015-11-24 (×4): qty 2

## 2015-11-24 MED ORDER — DOCUSATE SODIUM 100 MG PO CAPS
100.0000 mg | ORAL_CAPSULE | Freq: Two times a day (BID) | ORAL | Status: DC
Start: 2015-11-24 — End: 2015-11-25
  Administered 2015-11-24 – 2015-11-25 (×2): 100 mg via ORAL
  Filled 2015-11-24 (×2): qty 1

## 2015-11-24 MED ORDER — SUGAMMADEX SODIUM 200 MG/2ML IV SOLN
INTRAVENOUS | Status: DC | PRN
  Administered 2015-11-24: 150 mg via INTRAVENOUS

## 2015-11-24 MED ORDER — DIPHENHYDRAMINE HCL 12.5 MG/5ML PO ELIX: 12.5000 mg | ORAL_SOLUTION | Freq: Four times a day (QID) | ORAL | Status: DC | PRN

## 2015-11-24 MED ORDER — LIDOCAINE HCL (CARDIAC) 20 MG/ML IV SOLN
INTRAVENOUS | Status: DC | PRN
  Administered 2015-11-24: 80 mg via INTRAVENOUS

## 2015-11-24 MED ORDER — SULFAMETHOXAZOLE-TRIMETHOPRIM 800-160 MG PO TABS: 1.0000 | ORAL_TABLET | Freq: Two times a day (BID) | ORAL | Status: DC

## 2015-11-24 MED ORDER — PHENYLEPHRINE HCL 10 MG/ML IJ SOLN
INTRAMUSCULAR | Status: DC | PRN
  Administered 2015-11-24: 40 ug via INTRAVENOUS
  Administered 2015-11-24: 80 ug via INTRAVENOUS
  Administered 2015-11-24: 120 ug via INTRAVENOUS
  Administered 2015-11-24 (×2): 80 ug via INTRAVENOUS

## 2015-11-24 MED ORDER — SODIUM CHLORIDE 0.9 % IV BOLUS (SEPSIS)
1000.0000 mL | Freq: Once | INTRAVENOUS | Status: AC
  Administered 2015-11-24: 1000 mL via INTRAVENOUS

## 2015-11-24 MED ORDER — ROCURONIUM BROMIDE 100 MG/10ML IV SOLN
INTRAVENOUS | Status: AC
Start: 2015-11-24 — End: 2015-11-24
  Filled 2015-11-24: qty 1

## 2015-11-24 MED ORDER — MORPHINE SULFATE (PF) 2 MG/ML IV SOLN
2.0000 mg | INTRAVENOUS | Status: DC | PRN
  Administered 2015-11-24 – 2015-11-25 (×4): 2 mg via INTRAVENOUS
  Filled 2015-11-24 (×4): qty 1

## 2015-11-24 MED ORDER — KETOROLAC TROMETHAMINE 15 MG/ML IJ SOLN
INTRAMUSCULAR | Status: AC
  Filled 2015-11-24: qty 1

## 2015-11-24 MED ORDER — HYDROMORPHONE HCL 1 MG/ML IJ SOLN: 0.2500 mg | INTRAMUSCULAR | Status: DC | PRN

## 2015-11-24 MED ORDER — SUGAMMADEX SODIUM 200 MG/2ML IV SOLN
INTRAVENOUS | Status: AC
Start: 2015-11-24 — End: 2015-11-24
  Filled 2015-11-24: qty 2

## 2015-11-24 MED ORDER — CETYLPYRIDINIUM CHLORIDE 0.05 % MT LIQD
7.0000 mL | Freq: Two times a day (BID) | OROMUCOSAL | Status: DC
  Administered 2015-11-24 – 2015-11-25 (×2): 7 mL via OROMUCOSAL

## 2015-11-24 MED ORDER — BELLADONNA ALKALOIDS-OPIUM 16.2-60 MG RE SUPP
1.0000 | Freq: Four times a day (QID) | RECTAL | Status: DC | PRN
  Administered 2015-11-24 – 2015-11-25 (×2): 1 via RECTAL
  Filled 2015-11-24 (×2): qty 1

## 2015-11-24 MED ORDER — KCL IN DEXTROSE-NACL 20-5-0.45 MEQ/L-%-% IV SOLN
INTRAVENOUS | Status: DC
  Administered 2015-11-24 – 2015-11-25 (×2): via INTRAVENOUS
  Filled 2015-11-24 (×4): qty 1000

## 2015-11-24 MED ORDER — KETOROLAC TROMETHAMINE 15 MG/ML IJ SOLN
15.0000 mg | Freq: Four times a day (QID) | INTRAMUSCULAR | Status: DC
  Administered 2015-11-24 – 2015-11-25 (×4): 15 mg via INTRAVENOUS
  Filled 2015-11-24 (×5): qty 1

## 2015-11-24 MED ORDER — DEXAMETHASONE SODIUM PHOSPHATE 10 MG/ML IJ SOLN
INTRAMUSCULAR | Status: DC | PRN
  Administered 2015-11-24: 5 mg via INTRAVENOUS

## 2015-11-24 MED ORDER — LIDOCAINE HCL (CARDIAC) 20 MG/ML IV SOLN
INTRAVENOUS | Status: AC
  Filled 2015-11-24: qty 5

## 2015-11-24 MED ORDER — ACETAMINOPHEN 160 MG/5ML PO SOLN
975.0000 mg | Freq: Once | ORAL | Status: AC
  Administered 2015-11-24: 975 mg via ORAL
  Filled 2015-11-24: qty 40.6

## 2015-11-24 MED ORDER — ONDANSETRON HCL 4 MG/2ML IJ SOLN
INTRAMUSCULAR | Status: AC
  Filled 2015-11-24: qty 4

## 2015-11-24 MED ORDER — ONDANSETRON HCL 4 MG/2ML IJ SOLN
INTRAMUSCULAR | Status: DC | PRN
  Administered 2015-11-24 (×4): 2 mg via INTRAVENOUS

## 2015-11-24 MED ORDER — CEFAZOLIN SODIUM-DEXTROSE 2-3 GM-% IV SOLR
2.0000 g | INTRAVENOUS | Status: AC
  Administered 2015-11-24: 2 g via INTRAVENOUS

## 2015-11-24 MED ORDER — HYDROMORPHONE HCL 1 MG/ML IJ SOLN
0.2500 mg | INTRAMUSCULAR | Status: DC | PRN
  Administered 2015-11-24 (×5): 0.5 mg via INTRAVENOUS

## 2015-11-24 MED ORDER — DIPHENHYDRAMINE HCL 50 MG/ML IJ SOLN
12.5000 mg | Freq: Four times a day (QID) | INTRAMUSCULAR | Status: DC | PRN
Start: 2015-11-24 — End: 2015-11-25

## 2015-11-24 MED ORDER — CEFAZOLIN SODIUM 1-5 GM-% IV SOLN
1.0000 g | Freq: Three times a day (TID) | INTRAVENOUS | Status: AC
Start: 2015-11-24 — End: 2015-11-25
  Administered 2015-11-24 – 2015-11-25 (×2): 1 g via INTRAVENOUS
  Filled 2015-11-24 (×2): qty 50

## 2015-11-24 MED ORDER — BUPIVACAINE-EPINEPHRINE (PF) 0.25% -1:200000 IJ SOLN
INTRAMUSCULAR | Status: AC
  Filled 2015-11-24: qty 30

## 2015-11-24 MED ORDER — ACETAMINOPHEN 325 MG PO TABS: 650.0000 mg | ORAL_TABLET | ORAL | Status: DC | PRN

## 2015-11-24 MED ORDER — PROPOFOL 10 MG/ML IV BOLUS
INTRAVENOUS | Status: AC
  Filled 2015-11-24: qty 20

## 2015-11-24 MED ORDER — PROPOFOL 10 MG/ML IV BOLUS
INTRAVENOUS | Status: DC | PRN
  Administered 2015-11-24: 180 mg via INTRAVENOUS

## 2015-11-24 SURGICAL SUPPLY — 51 items
APPLICATOR COTTON TIP 6IN STRL (MISCELLANEOUS) ×4 IMPLANT
CATH FOLEY 2WAY SLVR 18FR 30CC (CATHETERS) ×4 IMPLANT
CATH ROBINSON RED A/P 16FR (CATHETERS) ×4 IMPLANT
CATH ROBINSON RED A/P 8FR (CATHETERS) ×4 IMPLANT
CATH TIEMANN FOLEY 18FR 5CC (CATHETERS) ×4 IMPLANT
CHLORAPREP W/TINT 26ML (MISCELLANEOUS) ×4 IMPLANT
CLIP LIGATING HEM O LOK PURPLE (MISCELLANEOUS) ×8 IMPLANT
COVER SURGICAL LIGHT HANDLE (MISCELLANEOUS) ×4 IMPLANT
COVER TIP SHEARS 8 DVNC (MISCELLANEOUS) ×2 IMPLANT
COVER TIP SHEARS 8MM DA VINCI (MISCELLANEOUS) ×2
CUTTER ECHEON FLEX ENDO 45 340 (ENDOMECHANICALS) ×4 IMPLANT
DECANTER SPIKE VIAL GLASS SM (MISCELLANEOUS) ×4 IMPLANT
DRAPE ARM DVNC X/XI (DISPOSABLE) ×8 IMPLANT
DRAPE COLUMN DVNC XI (DISPOSABLE) ×2 IMPLANT
DRAPE DA VINCI XI ARM (DISPOSABLE) ×8
DRAPE DA VINCI XI COLUMN (DISPOSABLE) ×2
DRAPE SURG IRRIG POUCH 19X23 (DRAPES) ×4 IMPLANT
DRSG TEGADERM 4X4.75 (GAUZE/BANDAGES/DRESSINGS) ×4 IMPLANT
ELECT REM PT RETURN 9FT ADLT (ELECTROSURGICAL) ×4
ELECTRODE REM PT RTRN 9FT ADLT (ELECTROSURGICAL) ×2 IMPLANT
GLOVE BIO SURGEON STRL SZ 6.5 (GLOVE) ×3 IMPLANT
GLOVE BIO SURGEONS STRL SZ 6.5 (GLOVE) ×1
GLOVE BIOGEL M STRL SZ7.5 (GLOVE) ×8 IMPLANT
GOWN STRL REUS W/TWL LRG LVL3 (GOWN DISPOSABLE) ×12 IMPLANT
HOLDER FOLEY CATH W/STRAP (MISCELLANEOUS) ×4 IMPLANT
IV LACTATED RINGERS 1000ML (IV SOLUTION) ×4 IMPLANT
LIQUID BAND (GAUZE/BANDAGES/DRESSINGS) ×2 IMPLANT
NDL SAFETY ECLIPSE 18X1.5 (NEEDLE) ×2 IMPLANT
NEEDLE HYPO 18GX1.5 SHARP (NEEDLE) ×4
PACK ROBOT UROLOGY CUSTOM (CUSTOM PROCEDURE TRAY) ×4 IMPLANT
RELOAD GREEN ECHELON 45 (STAPLE) ×4 IMPLANT
SEAL CANN UNIV 5-8 DVNC XI (MISCELLANEOUS) ×8 IMPLANT
SEAL XI 5MM-8MM UNIVERSAL (MISCELLANEOUS) ×8
SET TUBE IRRIG SUCTION NO TIP (IRRIGATION / IRRIGATOR) ×4 IMPLANT
SOLUTION ELECTROLUBE (MISCELLANEOUS) ×4 IMPLANT
SUT ETHILON 3 0 PS 1 (SUTURE) ×4 IMPLANT
SUT MNCRL 3 0 RB1 (SUTURE) ×2 IMPLANT
SUT MNCRL 3 0 VIOLET RB1 (SUTURE) ×2 IMPLANT
SUT MNCRL AB 4-0 PS2 18 (SUTURE) ×8 IMPLANT
SUT MONOCRYL 3 0 RB1 (SUTURE) ×4
SUT VIC AB 0 CT1 27 (SUTURE) ×8
SUT VIC AB 0 CT1 27XBRD ANTBC (SUTURE) ×2 IMPLANT
SUT VIC AB 0 UR5 27 (SUTURE) ×4 IMPLANT
SUT VIC AB 2-0 SH 27 (SUTURE) ×4
SUT VIC AB 2-0 SH 27X BRD (SUTURE) ×2 IMPLANT
SUT VICRYL 0 UR6 27IN ABS (SUTURE) ×8 IMPLANT
SYR 27GX1/2 1ML LL SAFETY (SYRINGE) ×4 IMPLANT
TOWEL OR 17X26 10 PK STRL BLUE (TOWEL DISPOSABLE) ×4 IMPLANT
TOWEL OR NON WOVEN STRL DISP B (DISPOSABLE) ×4 IMPLANT
TUBING INSUFFLATION 10FT LAP (TUBING) IMPLANT
WATER STERILE IRR 1500ML POUR (IV SOLUTION) ×8 IMPLANT

## 2015-11-24 NOTE — Progress Notes (Signed)
Patient ID: Blake Moss, male   DOB: 1951/02/04, 65 y.o.   MRN: [id number]  Post-op note  Subjective: The patient is doing well.  Complains of feeling to void and nausea.  Objective: Vital signs in last 24 hours: Temp:  [97.3 F (36.3 C)-98 F (36.7 C)] 97.3 F (36.3 C) (03/20 1548) Pulse Rate:  [76-100] 92 (03/20 1548) Resp:  [0-25] 16 (03/20 1548) BP: (114-146)/(74-94) 114/76 mmHg (03/20 1548) SpO2:  [95 %-100 %] 96 % (03/20 1548) Weight:  [73.568 kg (162 lb 3 oz)] 73.568 kg (162 lb 3 oz) (03/20 0949)  Intake/Output from previous day:   Intake/Output this shift: Total I/O In: 4500 [I.V.:2500; IV Piggyback:2000] Out: 380 [Urine:300; Drains:30; Blood:50]  Physical Exam:  General: Alert and oriented. Abdomen: Soft, Nondistended. Incisions: Clean and dry.  Lab Results:  Recent Labs  11/24/15 1423  HGB 14.5  HCT 43.4    Assessment/Plan: POD#0   1) Continue to monitor, ambulate, IS 2) B&O suppository, Zofran   Roxy Horseman, Brooke Bonito. MD   LOS: 0 days   Batya Citron,LES 11/24/2015, 5:22 PM

## 2015-11-24 NOTE — Discharge Instructions (Signed)

## 2015-11-24 NOTE — Transfer of Care (Signed)
Immediate Anesthesia Transfer of Care Note  Patient: Blake Moss  Procedure(s) Performed: Procedure(s): XI ROBOTIC ASSISTED LAPAROSCOPIC RADICAL PROSTATECTOMY LEVEL 2 (N/A) PELVIC LYMPHADENECTOMY (Bilateral)  Patient Location: PACU  Anesthesia Type:General  Level of Consciousness:  sedated, patient cooperative and responds to stimulation  Airway & Oxygen Therapy:Patient Spontanous Breathing and Patient connected to face mask oxgen  Post-op Assessment:  Report given to PACU RN and Post -op Vital signs reviewed and stable  Post vital signs:  Reviewed and stable  Last Vitals:  Filed Vitals:   11/24/15 0938  BP: 132/91  Pulse: 76  Temp: 36.5 C  Resp: 18    Complications: No apparent anesthesia complications

## 2015-11-24 NOTE — Anesthesia Procedure Notes (Signed)
Procedure Name: Intubation Date/Time: 11/24/2015 12:04 PM Performed by: Freddie Breech Pre-anesthesia Checklist: Patient identified, Emergency Drugs available, Suction available, Patient being monitored and Timeout performed Patient Re-evaluated:Patient Re-evaluated prior to inductionOxygen Delivery Method: Circle system utilized Preoxygenation: Pre-oxygenation with 100% oxygen Intubation Type: IV induction Ventilation: Mask ventilation without difficulty and Oral airway inserted - appropriate to patient size Grade View: Grade I Tube type: Oral Tube size: 7.5 mm Number of attempts: 1 Airway Equipment and Method: Patient positioned with wedge pillow and Stylet Placement Confirmation: ETT inserted through vocal cords under direct vision,  positive ETCO2,  CO2 detector and breath sounds checked- equal and bilateral Secured at: 22 cm Tube secured with: Tape Dental Injury: Teeth and Oropharynx as per pre-operative assessment

## 2015-11-24 NOTE — Interval H&P Note (Signed)
History and Physical Interval Note:  11/24/2015 9:58 AM  Blake Moss  has presented today for surgery, with the diagnosis of PROSTATE CANCER  The various methods of treatment have been discussed with the patient and family. After consideration of risks, benefits and other options for treatment, the patient has consented to  Procedure(s): XI ROBOTIC ASSISTED LAPAROSCOPIC RADICAL PROSTATECTOMY LEVEL 2 (N/A) PELVIC LYMPHADENECTOMY (Bilateral) as a surgical intervention .  The patient's history has been reviewed, patient examined, no change in status, stable for surgery.  I have reviewed the patient's chart and labs.  Questions were answered to the patient's satisfaction.     Chayah Mckee,LES

## 2015-11-24 NOTE — Op Note (Signed)
Preoperative diagnosis: Clinically localized adenocarcinoma of the prostate (clinical stage T1c N0 M0)  Postoperative diagnosis: Clinically localized adenocarcinoma of the prostate (clinical stage T1c N0 M0)  Procedure:  1. Robotic assisted laparoscopic radical prostatectomy (partial bilateral nerve sparing) 2. Bilateral robotic assisted laparoscopic pelvic lymphadenectomy  Surgeon: Pryor Curia. M.D.  Assistant: Debbrah Alar, PA-C  Resident: Dr. Jearld Adjutant  Anesthesia: General  Complications: None  EBL: 50 mL  IVF:  2000 mL crystalloid  Specimens: 1. Prostate and seminal vesicles 2. Right pelvic lymph nodes 3. Left pelvic lymph nodes  Disposition of specimens: Pathology  Drains: 1. 20 Fr coude catheter 2. # 19 Blake pelvic drain  Indication: Blake Moss is a 65 y.o. year old patient with clinically localized prostate cancer.  After a thorough review of the management options for treatment of prostate cancer, he elected to proceed with surgical therapy and the above procedure(s).  We have discussed the potential benefits and risks of the procedure, side effects of the proposed treatment, the likelihood of the patient achieving the goals of the procedure, and any potential problems that might occur during the procedure or recuperation. Informed consent has been obtained.  Description of procedure:  The patient was taken to the operating room and a general anesthetic was administered. He was given preoperative antibiotics, placed in the dorsal lithotomy position, and prepped and draped in the usual sterile fashion. Next a preoperative timeout was performed. A urethral catheter was placed into the bladder and a site was selected near the umbilicus for placement of the camera port. This was placed using a standard open Hassan technique which allowed entry into the peritoneal cavity under direct vision and without difficulty. An 8 mm robotic port was placed and a  pneumoperitoneum established. The camera was then used to inspect the abdomen and there was no evidence of any intra-abdominal injuries or other abnormalities. The remaining abdominal ports were then placed. 8 mm robotic ports were placed in the right lower quadrant, left lower quadrant, and far left lateral abdominal wall. A 5 mm port was placed in the right upper quadrant and a 12 mm port was placed in the right lateral abdominal wall for laparoscopic assistance. All ports were placed under direct vision without difficulty. The surgical cart was then docked.   Utilizing the cautery scissors, the bladder was reflected posteriorly allowing entry into the space of Retzius and identification of the endopelvic fascia and prostate. The periprostatic fat was then removed from the prostate allowing full exposure of the endopelvic fascia. The endopelvic fascia was then incised from the apex back to the base of the prostate bilaterally and the underlying levator muscle fibers were swept laterally off the prostate thereby isolating the dorsal venous complex. The dorsal vein was then stapled and divided with a 45 mm Flex Echelon stapler. Attention then turned to the bladder neck which was divided anteriorly thereby allowing entry into the bladder and exposure of the urethral catheter. The catheter balloon was deflated and the catheter was brought into the operative field and used to retract the prostate anteriorly. The posterior bladder neck was then examined and was divided allowing further dissection between the bladder and prostate posteriorly until the vasa deferentia and seminal vessels were identified. The vasa deferentia were isolated, divided, and lifted anteriorly. The seminal vesicles were dissected down to their tips with care to control the seminal vascular arterial blood supply. These structures were then lifted anteriorly and the space between Denonvillier's fascia and the  anterior rectum was developed with a  combination of sharp and blunt dissection. This isolated the vascular pedicles of the prostate.  The lateral prostatic fascia was then sharply incised allowing release of the neurovascular bundles bilaterally. The vascular pedicles of the prostate were then ligated with Weck clips with a margin of periprostatic fat left on the prostate and divided with sharp cold scissor dissection.  The urethra was then sharply transected allowing the prostate specimen to be disarticulated. The pelvis was copiously irrigated and hemostasis was ensured. There was no evidence for rectal injury.  Attention then turned to the right pelvic sidewall. The fibrofatty tissue between the external iliac vein, confluence of the iliac vessels, hypogastric artery, and Cooper's ligament was dissected free from the pelvic sidewall with care to preserve the obturator nerve. Weck clips were used for lymphostasis and hemostasis. An identical procedure was performed on the contralateral side and the lymphatic packets were removed for permanent pathologic analysis.  Attention then turned to the urethral anastomosis. A 2-0 Vicryl slip knot was placed between Denonvillier's fascia, the posterior bladder neck, and the posterior urethra to reapproximate these structures. A double-armed 3-0 Monocryl suture was then used to perform a 360 running tension-free anastomosis between the bladder neck and urethra. A new urethral catheter was then placed into the bladder and irrigated. There were no blood clots within the bladder and the anastomosis appeared to be watertight. A #19 Blake drain was then brought through the left lateral 8 mm port site and positioned appropriately within the pelvis. It was secured to the skin with a nylon suture. The surgical cart was then undocked. The right lateral 12 mm port site was closed at the fascial level with a 0 Vicryl suture placed laparoscopically. All remaining ports were then removed under direct vision. The  prostate specimen was removed intact within the Endopouch retrieval bag via the periumbilical camera port site. This fascial opening was closed with two running 0 Vicryl sutures. 0.25% Marcaine was then injected into all port sites and all incisions were reapproximated at the skin level with 4-0 Monocryl subcuticular sutures and Dermabond. The patient appeared to tolerate the procedure well and without complications. The patient was able to be extubated and transferred to the recovery unit in satisfactory condition.   Pryor Curia MD

## 2015-11-24 NOTE — Anesthesia Postprocedure Evaluation (Signed)
Anesthesia Post Note  Patient: Blake Moss  Procedure(s) Performed: Procedure(s) (LRB): XI ROBOTIC ASSISTED LAPAROSCOPIC RADICAL PROSTATECTOMY LEVEL 2 (N/A) PELVIC LYMPHADENECTOMY (Bilateral)  Patient location during evaluation: PACU Anesthesia Type: General Level of consciousness: awake and alert Pain management: pain level controlled Vital Signs Assessment: post-procedure vital signs reviewed and stable Respiratory status: spontaneous breathing, nonlabored ventilation, respiratory function stable and patient connected to nasal cannula oxygen Cardiovascular status: blood pressure returned to baseline and stable Postop Assessment: no signs of nausea or vomiting Anesthetic complications: no    Last Vitals:  Filed Vitals:   11/24/15 1445 11/24/15 1500  BP: 137/89 118/75  Pulse: 100 95  Temp:    Resp: 25 14    Last Pain:  Filed Vitals:   11/24/15 1505  PainSc: 10-Worst pain ever                 Fareed Fung J

## 2015-11-24 NOTE — Anesthesia Preprocedure Evaluation (Signed)
Anesthesia Evaluation  Patient identified by MRN, date of birth, ID band Patient awake    Reviewed: Allergy & Precautions, H&P , Patient's Chart, lab work & pertinent test results, reviewed documented beta blocker date and time   Airway Mallampati: II  TM Distance: >3 FB Neck ROM: full    Dental no notable dental hx.    Pulmonary    Pulmonary exam normal breath sounds clear to auscultation       Cardiovascular  Rhythm:regular Rate:Normal     Neuro/Psych    GI/Hepatic GERD  Medicated and Controlled,  Endo/Other    Renal/GU      Musculoskeletal   Abdominal   Peds  Hematology   Anesthesia Other Findings   Reproductive/Obstetrics                             Anesthesia Physical Anesthesia Plan  ASA: II  Anesthesia Plan: General   Post-op Pain Management:    Induction: Intravenous  Airway Management Planned: Oral ETT  Additional Equipment:   Intra-op Plan:   Post-operative Plan: Extubation in OR  Informed Consent: I have reviewed the patients History and Physical, chart, labs and discussed the procedure including the risks, benefits and alternatives for the proposed anesthesia with the patient or authorized representative who has indicated his/her understanding and acceptance.   Dental Advisory Given and Dental advisory given  Plan Discussed with: CRNA and Surgeon  Anesthesia Plan Comments: (  Discussed general anesthesia, including possible nausea, instrumentation of airway, sore throat,pulmonary aspiration, etc. I asked if the were any outstanding questions, or  concerns before we proceeded. )        Anesthesia Quick Evaluation

## 2015-11-25 LAB — HEMOGLOBIN AND HEMATOCRIT, BLOOD
HEMATOCRIT: 40.6 % (ref 39.0–52.0)
HEMOGLOBIN: 13.5 g/dL (ref 13.0–17.0)

## 2015-11-25 MED ORDER — BISACODYL 10 MG RE SUPP
10.0000 mg | Freq: Once | RECTAL | Status: AC
  Administered 2015-11-25: 10 mg via RECTAL
  Filled 2015-11-25: qty 1

## 2015-11-25 MED ORDER — HYDROCODONE-ACETAMINOPHEN 5-325 MG PO TABS
1.0000 | ORAL_TABLET | Freq: Four times a day (QID) | ORAL | Status: DC | PRN
  Administered 2015-11-25 (×2): 2 via ORAL
  Filled 2015-11-25 (×2): qty 2

## 2015-11-25 MED ORDER — BISACODYL 10 MG RE SUPP: 10.0000 mg | Freq: Once | RECTAL | Status: DC

## 2015-11-25 NOTE — Op Note (Deleted)
1 Day Post-Op Subjective: The patient is doing well.  Mild nausea, no vomiting. Pain is adequately controlled.  Objective: Vital signs in last 24 hours: Temp:  [97.3 F (36.3 C)-98.1 F (36.7 C)] 98.1 F (36.7 C) (03/21 0509) Pulse Rate:  [76-100] 86 (03/21 0509) Resp:  [0-25] 20 (03/21 0509) BP: (101-146)/(68-94) 101/68 mmHg (03/21 0509) SpO2:  [95 %-100 %] 98 % (03/21 0509) Weight:  [73.568 kg (162 lb 3 oz)] 73.568 kg (162 lb 3 oz) (03/20 0949)  Intake/Output from previous day: 03/20 0701 - 03/21 0700 In: 6332.5 [P.O.:800; I.V.:3532.5; IV Piggyback:2000] Out: 1521 [Urine:1350; Drains:121; Blood:50] Intake/Output this shift:    Physical Exam:  General: Alert and oriented. CV: RRR Lungs: Clear bilaterally. GI: Soft, Nondistended. Incisions: Clean, dry, and intact Urine: Clear Extremities: Nontender, no erythema, no edema.  Lab Results:  Recent Labs  11/24/15 1423 11/25/15 0544  HGB 14.5 13.5  HCT 43.4 40.6      Assessment/Plan: POD# 1 s/p robotic prostatectomy.  1) SL IVF 2) Ambulate, Incentive spirometry 3) Transition to oral pain medication 4) Dulcolax suppository 5) D/C pelvic drain 6) Plan for likely discharge later today   Pryor Curia. MD   LOS: 1 day   Christell Faith 11/25/2015, 7:28 AM

## 2015-11-25 NOTE — Care Management Note (Signed)
Case Management Note  Patient Details  Name: Blake Moss MRN: [id number] Date of Birth: 03/06/1951  Subjective/Objective:   65 y/o m admitted w/Prostate Ca. POD#1 lap rad prostatectomy. From home.                 Action/Plan:d/c home no needs or orders.   Expected Discharge Date:                  Expected Discharge Plan:  Home/Self Care  In-House Referral:     Discharge planning Services  CM Consult  Post Acute Care Choice:    Choice offered to:     DME Arranged:    DME Agency:     HH Arranged:    [place] Agency:     Status of Service:  Completed, signed off  Medicare Important Message Given:    Date Medicare IM Given:    Medicare IM give by:    Date Additional Medicare IM Given:    Additional Medicare Important Message give by:     If discussed at [place] of Stay Meetings, dates discussed:    Additional Comments:  Dessa Phi, RN 11/25/2015, 12:41 PM

## 2015-11-25 NOTE — Discharge Summary (Signed)
Date of admission: 11/24/2015  Date of discharge: 11/25/2015  Admission diagnosis: Prostate Cancer  Discharge diagnosis: Prostate Cancer  History and Physical: For full details, please see admission history and physical. Briefly, Blake Moss is a 65 y.o. gentleman with localized prostate cancer.  After discussing management/treatment options, he elected to proceed with surgical treatment.  Hospital Course: VARDAAN DEPASCALE was taken to the operating room on 11/24/2015 and underwent a robotic assisted laparoscopic radical prostatectomy. He tolerated this procedure well and without complications. Postoperatively, he was able to be transferred to a regular hospital room following recovery from anesthesia.  He was able to begin ambulating the night of surgery. He remained hemodynamically stable overnight.  He had excellent urine output with appropriately minimal output from his pelvic drain and his pelvic drain was removed on POD #1.  He was transitioned to oral pain medication, tolerated a clear liquid diet, and had met all discharge criteria and was able to be discharged home later on POD#1.  Laboratory values:  Recent Labs  11/24/15 1423 11/25/15 0544  HGB 14.5 13.5  HCT 43.4 40.6    Disposition: Home  Discharge instruction: He was instructed to be ambulatory but to refrain from heavy lifting, strenuous activity, or driving. He was instructed on urethral catheter care.  Discharge medications:     Medication List    STOP taking these medications        ibuprofen 200 MG tablet  Commonly known as:  ADVIL,MOTRIN      TAKE these medications        calcium carbonate 500 MG chewable tablet  Commonly known as:  TUMS - dosed in mg elemental calcium  Chew 1 tablet by mouth daily as needed (for gas).     HYDROcodone-acetaminophen 5-325 MG tablet  Commonly known as:  NORCO  Take 1-2 tablets by mouth every 6 (six) hours as needed.     omeprazole 20 MG capsule  Commonly known as:   PRILOSEC  Take 20 mg by mouth daily.     sulfamethoxazole-trimethoprim 800-160 MG tablet  Commonly known as:  BACTRIM DS,SEPTRA DS  Take 1 tablet by mouth 2 (two) times daily. Start the day prior to foley removal appointment        Followup: He will followup in 1 week for catheter removal and to discuss his surgical pathology results.

## 2015-11-25 NOTE — Progress Notes (Signed)
1 Day Post-Op Subjective: The patient is doing well.  Mild nausea, no vomiting. Pain is adequately controlled.  Objective: Vital signs in last 24 hours: Temp:  [97.3 F (36.3 C)-98.1 F (36.7 C)] 98.1 F (36.7 C) (03/21 0509) Pulse Rate:  [76-100] 86 (03/21 0509) Resp:  [0-25] 20 (03/21 0509) BP: (101-146)/(68-94) 101/68 mmHg (03/21 0509) SpO2:  [95 %-100 %] 98 % (03/21 0509) Weight:  [73.568 kg (162 lb 3 oz)] 73.568 kg (162 lb 3 oz) (03/20 0949)  Intake/Output from previous day: 03/20 0701 - 03/21 0700 In: 6332.5 [P.O.:800; I.V.:3532.5; IV Piggyback:2000] Out: 1521 [Urine:1350; Drains:121; Blood:50] Intake/Output this shift:    Physical Exam:  General: Alert and oriented. CV: RRR Lungs: Clear bilaterally. GI: Soft, Nondistended. Incisions: Clean, dry, and intact Urine: Clear Extremities: Nontender, no erythema, no edema.  Lab Results:  Recent Labs  11/24/15 1423 11/25/15 0544  HGB 14.5 13.5  HCT 43.4 40.6      Assessment/Plan: POD# 1 s/p robotic prostatectomy.  1) SL IVF 2) Ambulate, Incentive spirometry 3) Transition to oral pain medication 4) Dulcolax suppository 5) D/C pelvic drain 6) Plan for likely discharge later today   Pryor Curia. MD   LOS: 1 day   Christell Faith 11/25/2015, 7:30 AM

## 2016-04-28 ENCOUNTER — Other Ambulatory Visit: Payer: Self-pay | Admitting: Dermatology

## 2016-07-21 ENCOUNTER — Encounter (INDEPENDENT_AMBULATORY_CARE_PROVIDER_SITE_OTHER): Payer: Self-pay | Admitting: *Deleted

## 2016-08-27 ENCOUNTER — Encounter (INDEPENDENT_AMBULATORY_CARE_PROVIDER_SITE_OTHER): Payer: Self-pay | Admitting: *Deleted

## 2016-08-27 ENCOUNTER — Other Ambulatory Visit (INDEPENDENT_AMBULATORY_CARE_PROVIDER_SITE_OTHER): Payer: Self-pay | Admitting: *Deleted

## 2016-08-27 ENCOUNTER — Telehealth (INDEPENDENT_AMBULATORY_CARE_PROVIDER_SITE_OTHER): Payer: Self-pay | Admitting: *Deleted

## 2016-08-27 DIAGNOSIS — Z860100 Personal history of colon polyps, unspecified: Secondary | ICD-10-CM | POA: Insufficient documentation

## 2016-08-27 DIAGNOSIS — Z800 Family history of malignant neoplasm of digestive organs: Secondary | ICD-10-CM | POA: Insufficient documentation

## 2016-08-27 NOTE — Telephone Encounter (Signed)
Patient needs trilyte 

## 2016-09-01 MED ORDER — PEG 3350-KCL-NA BICARB-NACL 420 G PO SOLR: 4000.0000 mL | Freq: Once | ORAL | 0 refills | Status: AC

## 2016-09-09 ENCOUNTER — Encounter (INDEPENDENT_AMBULATORY_CARE_PROVIDER_SITE_OTHER): Payer: Self-pay | Admitting: *Deleted

## 2016-09-09 NOTE — Telephone Encounter (Addendum)
Error

## 2016-09-09 NOTE — Telephone Encounter (Signed)
This encounter was created in error - please disregard.

## 2016-09-29 ENCOUNTER — Telehealth (INDEPENDENT_AMBULATORY_CARE_PROVIDER_SITE_OTHER): Payer: Self-pay | Admitting: *Deleted

## 2016-09-29 NOTE — Telephone Encounter (Signed)
Referring MD/PCP: shah   Procedure: tcs  Reason/Indication:  Hx polyps, fam hx colon ca  Has patient had this procedure before?  Yes, 2012  If so, when, by whom and where?    Is there a family history of colon cancer?  Yes, father, maternal grandmother  Who?  What age when diagnosed?    Is patient diabetic?   no      Does patient have prosthetic heart valve or mechanical valve?  no  Do you have a pacemaker?  no  Has patient ever had endocarditis? no  Has patient had joint replacement within last 12 months?  no  Does patient tend to be constipated or take laxatives? no  Does patient have a history of alcohol/drug use?  no  Is patient on Coumadin, Plavix and/or Aspirin? no  Medications: ibuprofen prn  Allergies: pcn  Medication Adjustment:   Procedure date & time: 10/28/16 at 100

## 2016-09-30 NOTE — Telephone Encounter (Signed)
agree

## 2016-10-28 ENCOUNTER — Encounter (HOSPITAL_COMMUNITY)
Admission: RE | Disposition: A | Discharge status: Home / Self Care | Payer: Self-pay | Source: Ambulatory Visit | Attending: Internal Medicine

## 2016-10-28 ENCOUNTER — Ambulatory Visit (HOSPITAL_COMMUNITY)
Admission: RE | Admit: 2016-10-28 | Discharge: 2016-10-28 | Disposition: A | Discharge status: Home / Self Care | Payer: 59 | Source: Ambulatory Visit | Attending: Internal Medicine | Admitting: Internal Medicine

## 2016-10-28 ENCOUNTER — Encounter (HOSPITAL_COMMUNITY): Payer: Self-pay | Admitting: *Deleted

## 2016-10-28 DIAGNOSIS — Z09 Encounter for follow-up examination after completed treatment for conditions other than malignant neoplasm: Secondary | ICD-10-CM | POA: Diagnosis not present

## 2016-10-28 DIAGNOSIS — Z8546 Personal history of malignant neoplasm of prostate: Secondary | ICD-10-CM | POA: Diagnosis not present

## 2016-10-28 DIAGNOSIS — Z800 Family history of malignant neoplasm of digestive organs: Secondary | ICD-10-CM

## 2016-10-28 DIAGNOSIS — K644 Residual hemorrhoidal skin tags: Secondary | ICD-10-CM | POA: Insufficient documentation

## 2016-10-28 DIAGNOSIS — K5730 Diverticulosis of large intestine without perforation or abscess without bleeding: Secondary | ICD-10-CM | POA: Diagnosis not present

## 2016-10-28 DIAGNOSIS — Z86010 Personal history of colonic polyps: Secondary | ICD-10-CM | POA: Diagnosis not present

## 2016-10-28 DIAGNOSIS — Z880 Allergy status to penicillin: Secondary | ICD-10-CM | POA: Diagnosis not present

## 2016-10-28 DIAGNOSIS — K5790 Diverticulosis of intestine, part unspecified, without perforation or abscess without bleeding: Secondary | ICD-10-CM | POA: Diagnosis not present

## 2016-10-28 DIAGNOSIS — Z1211 Encounter for screening for malignant neoplasm of colon: Secondary | ICD-10-CM | POA: Insufficient documentation

## 2016-10-28 DIAGNOSIS — Z9079 Acquired absence of other genital organ(s): Secondary | ICD-10-CM | POA: Insufficient documentation

## 2016-10-28 HISTORY — PX: COLONOSCOPY: SHX5424

## 2016-10-28 SURGERY — COLONOSCOPY
Anesthesia: Moderate Sedation

## 2016-10-28 MED ORDER — SODIUM CHLORIDE 0.9 % IV SOLN
INTRAVENOUS | Status: DC
  Administered 2016-10-28: 12:00:00 via INTRAVENOUS

## 2016-10-28 MED ORDER — MIDAZOLAM HCL 5 MG/5ML IJ SOLN
INTRAMUSCULAR | Status: DC | PRN
  Administered 2016-10-28: 2 mg via INTRAVENOUS
  Administered 2016-10-28 (×2): 1 mg via INTRAVENOUS
  Administered 2016-10-28: 2 mg via INTRAVENOUS

## 2016-10-28 MED ORDER — MIDAZOLAM HCL 5 MG/5ML IJ SOLN
INTRAMUSCULAR | Status: AC
  Filled 2016-10-28: qty 10

## 2016-10-28 MED ORDER — STERILE WATER FOR IRRIGATION IR SOLN
Status: DC | PRN
  Administered 2016-10-28: 2.5 mL

## 2016-10-28 MED ORDER — MEPERIDINE HCL 50 MG/ML IJ SOLN
INTRAMUSCULAR | Status: AC
  Filled 2016-10-28: qty 1

## 2016-10-28 MED ORDER — MEPERIDINE HCL 50 MG/ML IJ SOLN
INTRAMUSCULAR | Status: DC | PRN
  Administered 2016-10-28 (×2): 25 mg via INTRAVENOUS

## 2016-10-28 NOTE — Discharge Instructions (Signed)
Resume usual medications and high fiber diet. No driving for 24 hours. Next colonoscopy in 5 years. Can use IBGuard over the counter        Colonoscopy, Adult, Care After This sheet gives you information about how to care for yourself after your procedure. Your doctor may also give you more specific instructions. If you have problems or questions, call your doctor. Follow these instructions at home: General instructions  For the first 24 hours after the procedure:  Do not drive or use machinery.  Do not sign important documents.  Do not drink alcohol.  Do your daily activities more slowly than normal.  Eat foods that are soft and easy to digest.  Rest often.  Take over-the-counter or prescription medicines only as told by your doctor.  It is up to you to get the results of your procedure. Ask your doctor, or the department performing the procedure, when your results will be ready. To help cramping and bloating:  Try walking around.  Put heat on your belly (abdomen) as told by your doctor. Use a heat source that your doctor recommends, such as a moist heat pack or a heating pad.  Put a towel between your skin and the heat source.  Leave the heat on for 20-30 minutes.  Remove the heat if your skin turns bright red. This is especially important if you cannot feel pain, heat, or cold. You can get burned. Eating and drinking  Drink enough fluid to keep your pee (urine) clear or pale yellow.  Return to your normal diet as told by your doctor. Avoid heavy or fried foods that are hard to digest.  Avoid drinking alcohol for as long as told by your doctor. Contact a doctor if:  You have blood in your poop (stool) 2-3 days after the procedure. Get help right away if:  You have more than a small amount of blood in your poop.  You see large clumps of tissue (blood clots) in your poop.  Your belly is swollen.  You feel sick to your stomach (nauseous).  You throw up  (vomit).  You have a fever.  You have belly pain that gets worse, and medicine does not help your pain. This information is not intended to replace advice given to you by your health care provider. Make sure you discuss any questions you have with your health care provider. Document Released: 09/25/2010 Document Revised: 05/17/2016 Document Reviewed: 05/17/2016 Elsevier Interactive Patient Education  2017 [employer].    High-Fiber Diet Fiber, also called dietary fiber, is a type of carbohydrate found in fruits, vegetables, whole grains, and beans. A high-fiber diet can have many health benefits. Your health care provider may recommend a high-fiber diet to help:  Prevent constipation. Fiber can make your bowel movements more regular.  Lower your cholesterol.  Relieve hemorrhoids, uncomplicated diverticulosis, or irritable bowel syndrome.  Prevent overeating as part of a weight-loss plan.  Prevent heart disease, type 2 diabetes, and certain cancers. What is my plan? The recommended daily intake of fiber includes:  38 grams for men under age 77.  23 grams for men over age 17.  31 grams for women under age 28.  7 grams for women over age 87. You can get the recommended daily intake of dietary fiber by eating a variety of fruits, vegetables, grains, and beans. Your health care provider may also recommend a fiber supplement if it is not possible to get enough fiber through your diet. What do I  need to know about a high-fiber diet?  Fiber supplements have not been widely studied for their effectiveness, so it is better to get fiber through food sources.  Always check the fiber content on thenutrition facts label of any prepackaged food. Look for foods that contain at least 5 grams of fiber per serving.  Ask your dietitian if you have questions about specific foods that are related to your condition, especially if those foods are not listed in the following section.  Increase  your daily fiber consumption gradually. Increasing your intake of dietary fiber too quickly may cause bloating, cramping, or gas.  Drink plenty of water. Water helps you to digest fiber. What foods can I eat? Grains  Whole-grain breads. Multigrain cereal. Oats and oatmeal. Brown rice. Barley. Bulgur wheat. Ramona. Bran muffins. Popcorn. Rye wafer crackers. Vegetables  Sweet potatoes. Spinach. Kale. Artichokes. Cabbage. Broccoli. Green peas. Carrots. Squash. Fruits  Berries. Pears. Apples. Oranges. Avocados. Prunes and raisins. Dried figs. Meats and Other Protein Sources  Navy, kidney, pinto, and soy beans. Split peas. Lentils. Nuts and seeds. Dairy  Fiber-fortified yogurt. Beverages  Fiber-fortified soy milk. Fiber-fortified orange juice. Other  Fiber bars. The items listed above may not be a complete list of recommended foods or beverages. Contact your dietitian for more options.  What foods are not recommended? Grains  White bread. Pasta made with refined flour. White rice. Vegetables  Fried potatoes. Canned vegetables. Well-cooked vegetables. Fruits  Fruit juice. Cooked, strained fruit. Meats and Other Protein Sources  Fatty cuts of meat. Fried Sales executive or fried fish. Dairy  Milk. Yogurt. Cream cheese. Sour cream. Beverages  Soft drinks. Other  Cakes and pastries. Butter and oils. The items listed above may not be a complete list of foods and beverages to avoid. Contact your dietitian for more information.  What are some tips for including high-fiber foods in my diet?  Eat a wide variety of high-fiber foods.  Make sure that half of all grains consumed each day are whole grains.  Replace breads and cereals made from refined flour or white flour with whole-grain breads and cereals.  Replace white rice with brown rice, bulgur wheat, or millet.  Start the day with a breakfast that is high in fiber, such as a cereal that contains at least 5 grams of fiber per serving.  Use  beans in place of meat in soups, salads, or pasta.  Eat high-fiber snacks, such as berries, raw vegetables, nuts, or popcorn. This information is not intended to replace advice given to you by your health care provider. Make sure you discuss any questions you have with your health care provider. Document Released: 08/23/2005 Document Revised: 01/29/2016 Document Reviewed: 02/05/2014 Elsevier Interactive Patient Education  2017 [employer].

## 2016-10-28 NOTE — H&P (Signed)
Blake Moss is an 66 y.o. male.   Chief Complaint: Patient is here for colonoscopy. HPI: A 66 year old Caucasian male with history of colonic polyps and family history of CRC in father and maternal grandmother was here for surveillance colonoscopy. Last exam was in fall of 2012 and no polyps were found. He had probiotic radical prostatectomy in March last year. He says he's had some stool caliber change since then. However he has not noted any bleeding. Father had colon carcinoma in the 52s. Maternal grandmother also had colon cancer.  Past Medical History:  Diagnosis Date  . Cancer [facility])    prostate cancer  . Diverticulitis   . GERD (gastroesophageal reflux disease)   . Pneumonia 1997    Past Surgical History:  Procedure Laterality Date  . COLONOSCOPY  07/23/2011   Procedure: COLONOSCOPY;  Surgeon: Rogene Houston, MD;  Location: [facility];  Service: Endoscopy;  Laterality: N/A;  7:30  . footl surgery  a   as a child  . HERNIA REPAIR     left inguinal 4 yrs ago  . LYMPHADENECTOMY Bilateral 11/24/2015   Procedure: PELVIC LYMPHADENECTOMY;  Surgeon: Raynelle Bring, MD;  Location: [facility] OR[facility];  Service: Urology;  Laterality: Bilateral;  . PROSTATE BIOPSY    . ROBOT ASSISTED LAPAROSCOPIC RADICAL PROSTATECTOMY N/A 11/24/2015   Procedure: XI ROBOTIC ASSISTED LAPAROSCOPIC RADICAL PROSTATECTOMY LEVEL 2;  Surgeon: Raynelle Bring, MD;  Location: [facility] ORS;  Service: Urology;  Laterality: N/A;    Family History  Problem Relation Age of Onset  . Coronary artery disease Mother   . Colon cancer Father   . Lupus Father   . Colon cancer Other    Social History:  reports that he has never smoked. He has never used smokeless tobacco. He reports that he does not drink alcohol or use drugs.  Allergies:  Allergies  Allergen Reactions  . Penicillins Rash    .Marland KitchenHas patient had a PCN reaction causing immediate rash, facial/tongue/throat swelling, SOB or lightheadedness with hypotension: No Has patient  had a PCN reaction causing severe rash involving mucus membranes or skin necrosis: No Has patient had a PCN reaction that required hospitalization No Has patient had a PCN reaction occurring within the last 10 years: No If all of the above answers are "NO", then may proceed with Cephalosporin use.     Medications Prior to Admission  Medication Sig Dispense Refill  . ibuprofen (ADVIL,MOTRIN) 200 MG tablet Take 400 mg by mouth daily as needed for mild pain.    Marland Kitchen HYDROcodone-acetaminophen (NORCO) 5-325 MG tablet Take 1-2 tablets by mouth every 6 (six) hours as needed. (Patient not taking: Reported on 10/22/2016) 30 tablet 0  . sulfamethoxazole-trimethoprim (BACTRIM DS,SEPTRA DS) 800-160 MG tablet Take 1 tablet by mouth 2 (two) times daily. Start the day prior to foley removal appointment (Patient not taking: Reported on 10/22/2016) 6 tablet 0    No results found for this or any previous visit (from the past 48 hour(s)). No results found.  ROS  Blood pressure 135/90, pulse 72, temperature 97.9 F (36.6 C), temperature source Oral, resp. rate 11, height 5\' 11"  (1.803 m), weight 155 lb (70.3 kg), SpO2 100 %. Physical Exam  Constitutional: He appears well-developed and well-nourished.  HENT:  Mouth/Throat: Oropharynx is clear and moist.  Eyes: Conjunctivae are normal. No scleral icterus.  Neck: No thyromegaly present.  Cardiovascular: Normal rate, regular rhythm and normal heart sounds.   No murmur heard. Respiratory: Effort normal and breath sounds normal.  GI:  Abdomen is symmetrical. Small birth mark below the umbilicus. Abdomen is soft with mild tenderness at left mid abdomen. No organomegaly or masses.  Musculoskeletal: He exhibits no edema.  Lymphadenopathy:    He has no cervical adenopathy.  Neurological: He is alert.  Skin: Skin is warm and dry.     Assessment/Plan History of colonic polyps. Family history of CRC in first and second-degree relatives. Surveillance  colonoscopy.  Hildred Laser, MD 10/28/2016, 12:39 PM

## 2016-10-28 NOTE — Op Note (Signed)
[facility] Patient Name: Blake Moss Procedure Date: 10/28/2016 12:16 PM MRN: [id number] Date of Birth: [birth year] Attending MD: Hildred Laser , MD CSN: [id number] Age: 66 Admit Type: Outpatient Procedure:                Colonoscopy Indications:              High risk colon cancer surveillance: Personal                            history of colonic polyps, Family history of colon                            cancer in a first-degree relative Providers:                Hildred Laser, MD, Charlyne Petrin RN, RN, Aram Candela Referring MD:             Monico Blitz, MD Medicines:                Meperidine 50 mg IV, Midazolam 6 mg IV Complications:            No immediate complications. Estimated Blood Loss:     Estimated blood loss: none. Procedure:                Pre-Anesthesia Assessment:                           - Prior to the procedure, a History and Physical                            was performed, and patient medications and                            allergies were reviewed. The patient's tolerance of                            previous anesthesia was also reviewed. The risks                            and benefits of the procedure and the sedation                            options and risks were discussed with the patient.                            All questions were answered, and informed consent                            was obtained. Prior Anticoagulants: The patient has                            taken no previous anticoagulant or antiplatelet  agents. ASA Grade Assessment: II - A patient with                            mild systemic disease. After reviewing the risks                            and benefits, the patient was deemed in                            satisfactory condition to undergo the procedure.                           After obtaining informed consent, the colonoscope                            was  passed under direct vision. Throughout the                            procedure, the patient's blood pressure, pulse, and                            oxygen saturations were monitored continuously. The                            [id number]) scope was introduced through                            the anus and advanced to the the cecum, identified                            by appendiceal orifice and ileocecal valve. The                            colonoscopy was performed without difficulty. The                            patient tolerated the procedure well. The quality                            of the bowel preparation was excellent. The                            ileocecal valve, appendiceal orifice, and rectum                            were photographed. Scope In: 12:50:26 PM Scope Out: 1:06:09 PM Scope Withdrawal Time: 0 hours 8 minutes 40 seconds  Total Procedure Duration: 0 hours 15 minutes 43 seconds  Findings:      Scattered medium-mouthed diverticula were found in the sigmoid colon.      The exam was otherwise normal throughout the examined colon.      External hemorrhoids were found during retroflexion. The hemorrhoids       were small. Impression:               -  Diverticulosis in the sigmoid colon.                           - External hemorrhoids.                           - No specimens collected. Moderate Sedation:      Moderate (conscious) sedation was administered by the endoscopy nurse       and supervised by the endoscopist. The following parameters were       monitored: oxygen saturation, heart rate, blood pressure, CO2       capnography and response to care. Total physician intraservice time was       21 minutes. Recommendation:           - Patient has a contact number available for                            emergencies. The signs and symptoms of potential                            delayed complications were discussed with the                             patient. Return to normal activities tomorrow.                            Written discharge instructions were provided to the                            patient.                           - High fiber diet today.                           - Continue present medications.                           - Repeat colonoscopy in 5 years for surveillance. Procedure Code(s):        --- Professional ---                           [phone], Colonoscopy, flexible; diagnostic, including                            collection of specimen(s) by brushing or washing,                            when performed (separate procedure)                           99152, Moderate sedation services provided by the                            same physician or other qualified health care  professional performing the diagnostic or                            therapeutic service that the sedation supports,                            requiring the presence of an independent trained                            observer to assist in the monitoring of the                            patient's level of consciousness and physiological                            status; initial 15 minutes of intraservice time,                            patient age 54 years or older Diagnosis Code(s):        --- Professional ---                           Z86.010, Personal history of colonic polyps                           K64.4, Residual hemorrhoidal skin tags                           Z80.0, Family history of malignant neoplasm of                            digestive organs                           K57.30, Diverticulosis of large intestine without                            perforation or abscess without bleeding CPT copyright 2016 American [employer] Association. All rights reserved. The codes documented in this report are preliminary and upon coder review may  be revised to meet current compliance requirements. Hildred Laser,  MD Hildred Laser, MD 10/28/2016 1:23:10 PM This report has been signed electronically. Number of Addenda: 0

## 2016-10-29 ENCOUNTER — Encounter (HOSPITAL_COMMUNITY): Payer: Self-pay | Admitting: Internal Medicine

## 2017-01-05 DIAGNOSIS — R102 Pelvic and perineal pain: Secondary | ICD-10-CM | POA: Diagnosis not present

## 2017-01-05 DIAGNOSIS — M62838 Other muscle spasm: Secondary | ICD-10-CM | POA: Diagnosis not present

## 2017-01-05 DIAGNOSIS — M6281 Muscle weakness (generalized): Secondary | ICD-10-CM | POA: Diagnosis not present

## 2017-01-05 DIAGNOSIS — N393 Stress incontinence (female) (male): Secondary | ICD-10-CM | POA: Diagnosis not present

## 2017-01-05 DIAGNOSIS — R278 Other lack of coordination: Secondary | ICD-10-CM | POA: Diagnosis not present

## 2017-01-12 DIAGNOSIS — N393 Stress incontinence (female) (male): Secondary | ICD-10-CM | POA: Diagnosis not present

## 2017-01-12 DIAGNOSIS — R102 Pelvic and perineal pain: Secondary | ICD-10-CM | POA: Diagnosis not present

## 2017-01-12 DIAGNOSIS — M62838 Other muscle spasm: Secondary | ICD-10-CM | POA: Diagnosis not present

## 2017-01-12 DIAGNOSIS — M6281 Muscle weakness (generalized): Secondary | ICD-10-CM | POA: Diagnosis not present

## 2017-01-19 DIAGNOSIS — M62838 Other muscle spasm: Secondary | ICD-10-CM | POA: Diagnosis not present

## 2017-01-19 DIAGNOSIS — M6281 Muscle weakness (generalized): Secondary | ICD-10-CM | POA: Diagnosis not present

## 2017-01-19 DIAGNOSIS — R102 Pelvic and perineal pain: Secondary | ICD-10-CM | POA: Diagnosis not present

## 2017-01-19 DIAGNOSIS — N393 Stress incontinence (female) (male): Secondary | ICD-10-CM | POA: Diagnosis not present

## 2017-01-24 ENCOUNTER — Other Ambulatory Visit: Payer: Self-pay | Admitting: Dermatology

## 2017-01-24 DIAGNOSIS — L820 Inflamed seborrheic keratosis: Secondary | ICD-10-CM | POA: Diagnosis not present

## 2017-01-24 DIAGNOSIS — D229 Melanocytic nevi, unspecified: Secondary | ICD-10-CM | POA: Diagnosis not present

## 2017-01-24 DIAGNOSIS — D492 Neoplasm of unspecified behavior of bone, soft tissue, and skin: Secondary | ICD-10-CM | POA: Diagnosis not present

## 2017-01-24 DIAGNOSIS — L570 Actinic keratosis: Secondary | ICD-10-CM | POA: Diagnosis not present

## 2017-01-26 DIAGNOSIS — M6281 Muscle weakness (generalized): Secondary | ICD-10-CM | POA: Diagnosis not present

## 2017-01-26 DIAGNOSIS — R102 Pelvic and perineal pain: Secondary | ICD-10-CM | POA: Diagnosis not present

## 2017-01-26 DIAGNOSIS — N393 Stress incontinence (female) (male): Secondary | ICD-10-CM | POA: Diagnosis not present

## 2017-01-26 DIAGNOSIS — R278 Other lack of coordination: Secondary | ICD-10-CM | POA: Diagnosis not present

## 2017-01-26 DIAGNOSIS — M62838 Other muscle spasm: Secondary | ICD-10-CM | POA: Diagnosis not present

## 2017-01-27 DIAGNOSIS — H43813 Vitreous degeneration, bilateral: Secondary | ICD-10-CM | POA: Diagnosis not present

## 2017-01-27 DIAGNOSIS — H25813 Combined forms of age-related cataract, bilateral: Secondary | ICD-10-CM | POA: Diagnosis not present

## 2017-01-27 DIAGNOSIS — H524 Presbyopia: Secondary | ICD-10-CM | POA: Diagnosis not present

## 2017-02-02 DIAGNOSIS — M62838 Other muscle spasm: Secondary | ICD-10-CM | POA: Diagnosis not present

## 2017-02-02 DIAGNOSIS — R278 Other lack of coordination: Secondary | ICD-10-CM | POA: Diagnosis not present

## 2017-02-02 DIAGNOSIS — M6281 Muscle weakness (generalized): Secondary | ICD-10-CM | POA: Diagnosis not present

## 2017-02-02 DIAGNOSIS — R102 Pelvic and perineal pain: Secondary | ICD-10-CM | POA: Diagnosis not present

## 2017-02-02 DIAGNOSIS — N393 Stress incontinence (female) (male): Secondary | ICD-10-CM | POA: Diagnosis not present

## 2017-02-10 DIAGNOSIS — R102 Pelvic and perineal pain: Secondary | ICD-10-CM | POA: Diagnosis not present

## 2017-02-10 DIAGNOSIS — M62838 Other muscle spasm: Secondary | ICD-10-CM | POA: Diagnosis not present

## 2017-02-10 DIAGNOSIS — N393 Stress incontinence (female) (male): Secondary | ICD-10-CM | POA: Diagnosis not present

## 2017-02-10 DIAGNOSIS — M6281 Muscle weakness (generalized): Secondary | ICD-10-CM | POA: Diagnosis not present

## 2017-02-10 DIAGNOSIS — R278 Other lack of coordination: Secondary | ICD-10-CM | POA: Diagnosis not present

## 2017-02-15 IMAGING — DX DG CHEST 2V
2 series · 2 of 2 positions shown · non-contrast
Comparison: 03/29/2003

CLINICAL DATA: Preop.  Prostatectomy.

EXAM:
CHEST  2 VIEW

[chest pa]
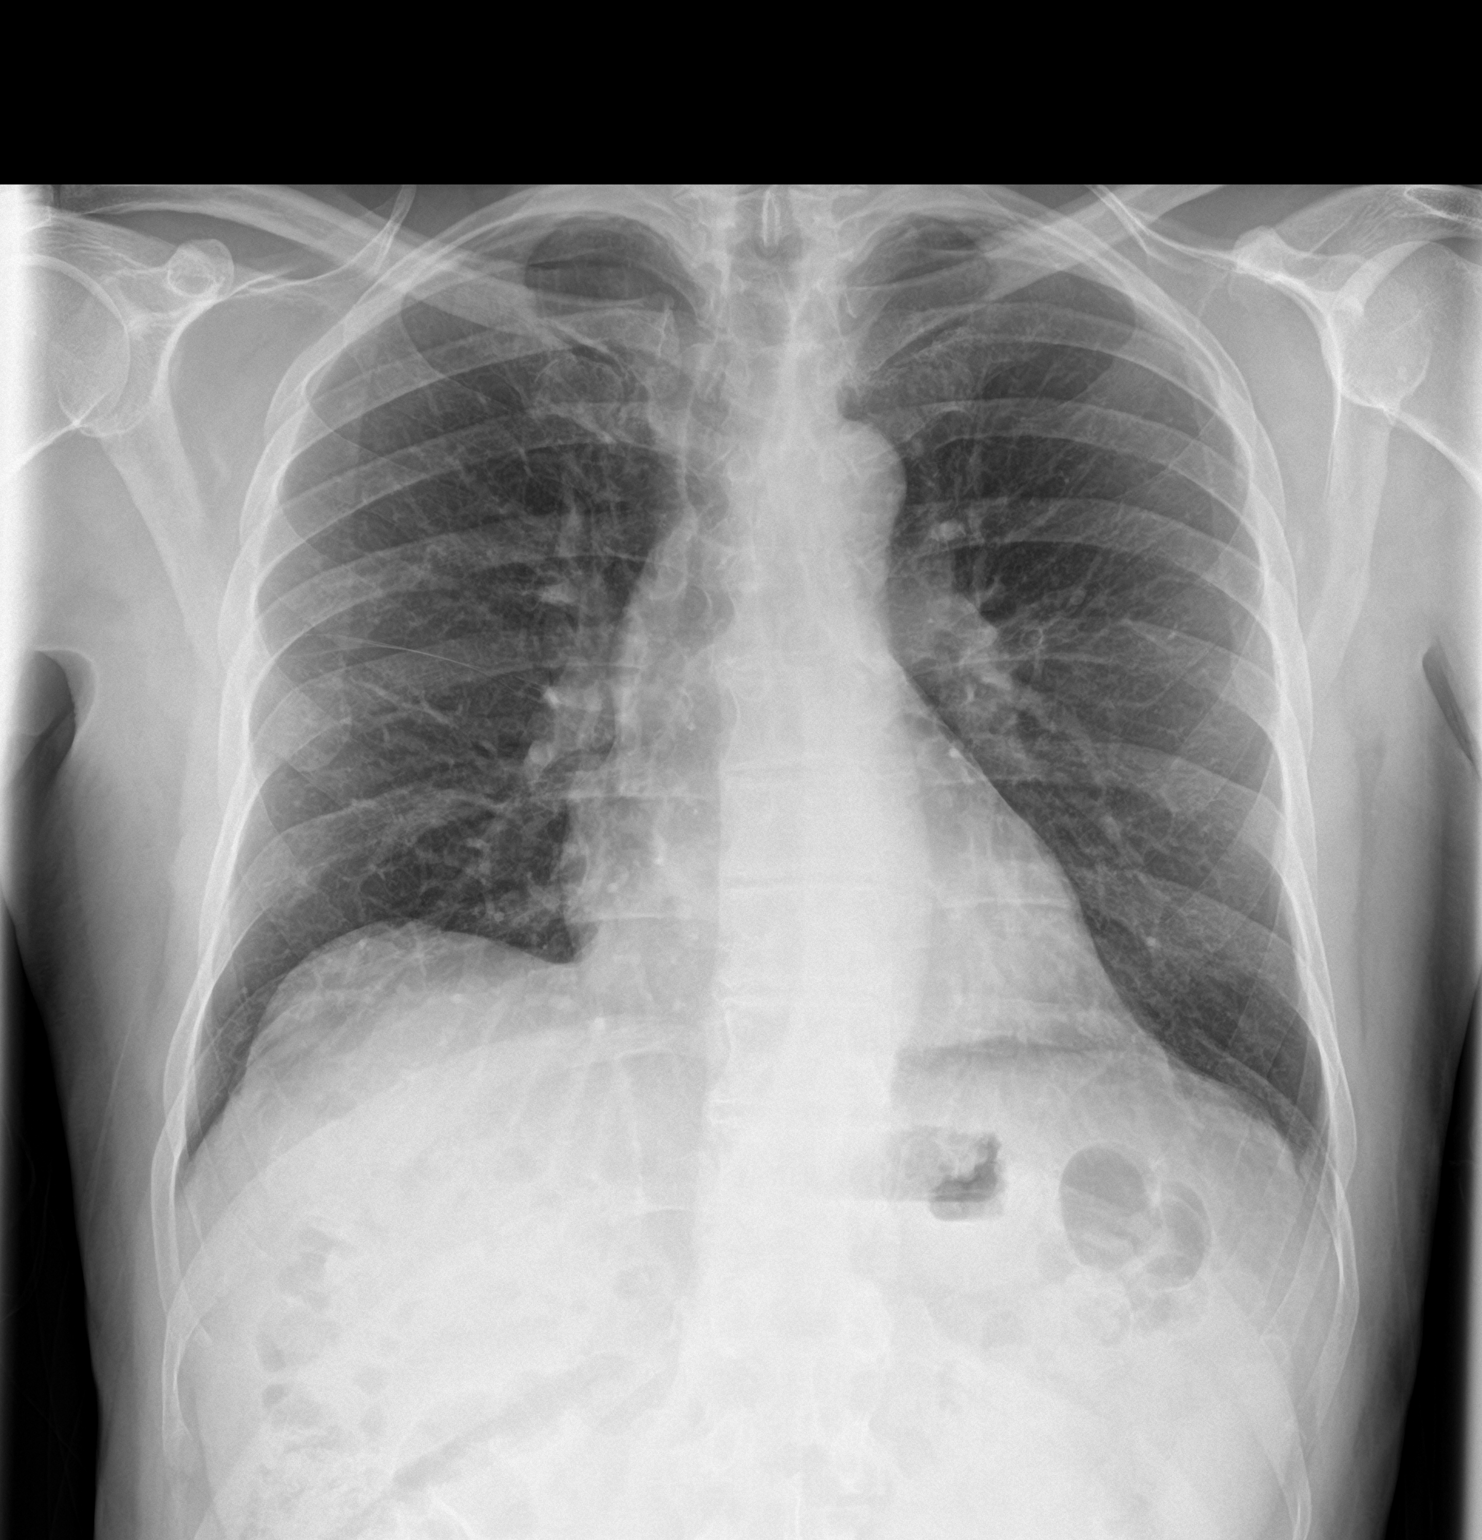

[chest lat]
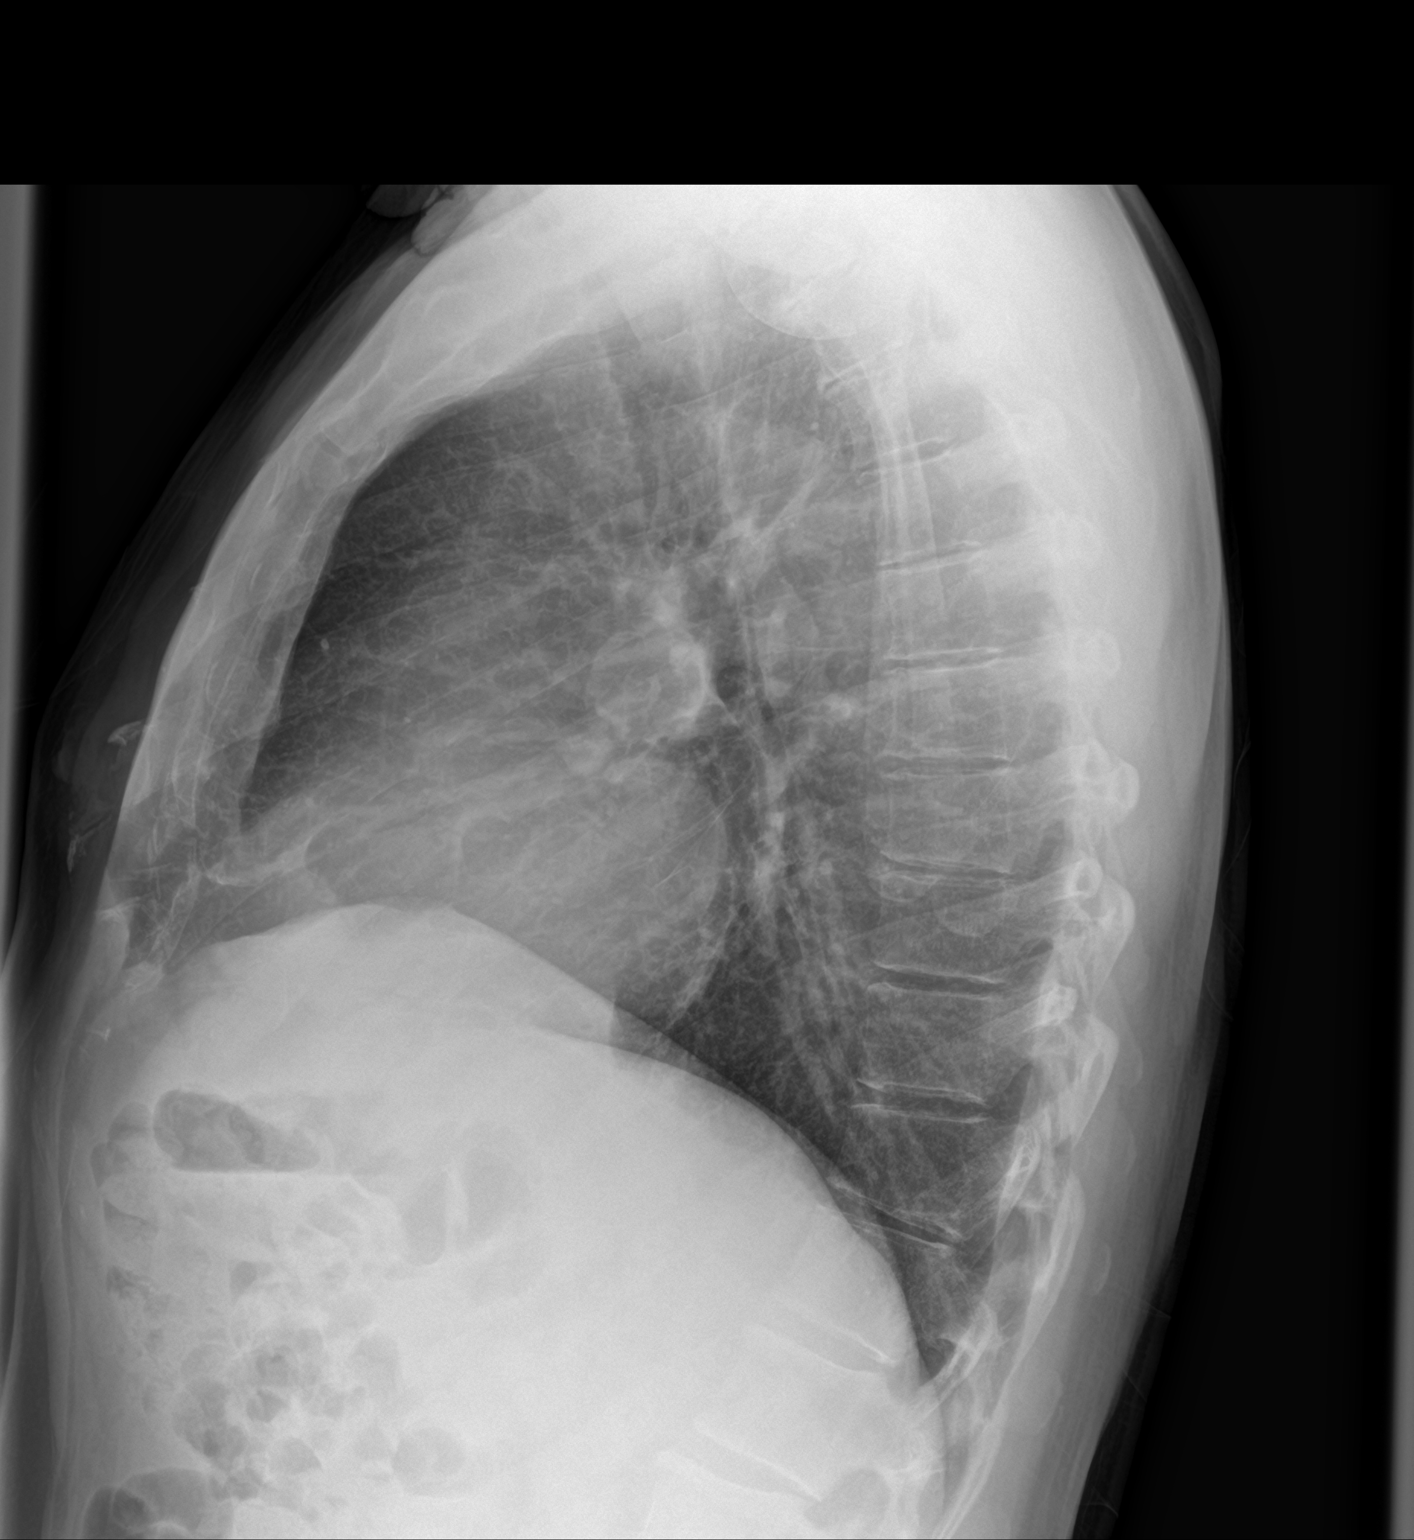

[2 of 2 positions shown; findings below may reference images not displayed]

FINDINGS: Normal heart size. Lungs clear. No pneumothorax. No pleural
effusion.
IMPRESSION: No active cardiopulmonary disease.

## 2017-02-23 DIAGNOSIS — N393 Stress incontinence (female) (male): Secondary | ICD-10-CM | POA: Diagnosis not present

## 2017-02-23 DIAGNOSIS — R278 Other lack of coordination: Secondary | ICD-10-CM | POA: Diagnosis not present

## 2017-02-28 DIAGNOSIS — H2511 Age-related nuclear cataract, right eye: Secondary | ICD-10-CM | POA: Diagnosis not present

## 2017-03-02 DIAGNOSIS — N393 Stress incontinence (female) (male): Secondary | ICD-10-CM | POA: Diagnosis not present

## 2017-03-02 DIAGNOSIS — M62838 Other muscle spasm: Secondary | ICD-10-CM | POA: Diagnosis not present

## 2017-03-02 DIAGNOSIS — M6281 Muscle weakness (generalized): Secondary | ICD-10-CM | POA: Diagnosis not present

## 2017-03-02 DIAGNOSIS — R102 Pelvic and perineal pain: Secondary | ICD-10-CM | POA: Diagnosis not present

## 2017-03-10 DIAGNOSIS — M6281 Muscle weakness (generalized): Secondary | ICD-10-CM | POA: Diagnosis not present

## 2017-03-10 DIAGNOSIS — M62838 Other muscle spasm: Secondary | ICD-10-CM | POA: Diagnosis not present

## 2017-03-10 DIAGNOSIS — N393 Stress incontinence (female) (male): Secondary | ICD-10-CM | POA: Diagnosis not present

## 2017-03-10 DIAGNOSIS — R102 Pelvic and perineal pain: Secondary | ICD-10-CM | POA: Diagnosis not present

## 2017-03-16 DIAGNOSIS — N393 Stress incontinence (female) (male): Secondary | ICD-10-CM | POA: Diagnosis not present

## 2017-03-16 DIAGNOSIS — R102 Pelvic and perineal pain: Secondary | ICD-10-CM | POA: Diagnosis not present

## 2017-03-16 DIAGNOSIS — M62838 Other muscle spasm: Secondary | ICD-10-CM | POA: Diagnosis not present

## 2017-03-16 DIAGNOSIS — M6281 Muscle weakness (generalized): Secondary | ICD-10-CM | POA: Diagnosis not present

## 2017-03-21 DIAGNOSIS — H25811 Combined forms of age-related cataract, right eye: Secondary | ICD-10-CM | POA: Diagnosis not present

## 2017-03-21 DIAGNOSIS — H2511 Age-related nuclear cataract, right eye: Secondary | ICD-10-CM | POA: Diagnosis not present

## 2017-03-22 DIAGNOSIS — H43813 Vitreous degeneration, bilateral: Secondary | ICD-10-CM | POA: Diagnosis not present

## 2017-03-22 DIAGNOSIS — H35373 Puckering of macula, bilateral: Secondary | ICD-10-CM | POA: Diagnosis not present

## 2017-03-22 DIAGNOSIS — H35411 Lattice degeneration of retina, right eye: Secondary | ICD-10-CM | POA: Diagnosis not present

## 2017-03-22 DIAGNOSIS — H43393 Other vitreous opacities, bilateral: Secondary | ICD-10-CM | POA: Diagnosis not present

## 2017-03-23 DIAGNOSIS — M62838 Other muscle spasm: Secondary | ICD-10-CM | POA: Diagnosis not present

## 2017-03-23 DIAGNOSIS — R102 Pelvic and perineal pain: Secondary | ICD-10-CM | POA: Diagnosis not present

## 2017-03-23 DIAGNOSIS — N393 Stress incontinence (female) (male): Secondary | ICD-10-CM | POA: Diagnosis not present

## 2017-03-23 DIAGNOSIS — M6281 Muscle weakness (generalized): Secondary | ICD-10-CM | POA: Diagnosis not present

## 2017-03-28 DIAGNOSIS — H2512 Age-related nuclear cataract, left eye: Secondary | ICD-10-CM | POA: Diagnosis not present

## 2017-03-30 DIAGNOSIS — H43813 Vitreous degeneration, bilateral: Secondary | ICD-10-CM | POA: Diagnosis not present

## 2017-03-30 DIAGNOSIS — H2512 Age-related nuclear cataract, left eye: Secondary | ICD-10-CM | POA: Diagnosis not present

## 2017-03-30 DIAGNOSIS — M6281 Muscle weakness (generalized): Secondary | ICD-10-CM | POA: Diagnosis not present

## 2017-03-30 DIAGNOSIS — H35373 Puckering of macula, bilateral: Secondary | ICD-10-CM | POA: Diagnosis not present

## 2017-03-30 DIAGNOSIS — M62838 Other muscle spasm: Secondary | ICD-10-CM | POA: Diagnosis not present

## 2017-03-30 DIAGNOSIS — N393 Stress incontinence (female) (male): Secondary | ICD-10-CM | POA: Diagnosis not present

## 2017-03-30 DIAGNOSIS — H35411 Lattice degeneration of retina, right eye: Secondary | ICD-10-CM | POA: Diagnosis not present

## 2017-03-30 DIAGNOSIS — R102 Pelvic and perineal pain: Secondary | ICD-10-CM | POA: Diagnosis not present

## 2017-04-11 DIAGNOSIS — H25812 Combined forms of age-related cataract, left eye: Secondary | ICD-10-CM | POA: Diagnosis not present

## 2017-04-11 DIAGNOSIS — H2512 Age-related nuclear cataract, left eye: Secondary | ICD-10-CM | POA: Diagnosis not present

## 2017-04-13 DIAGNOSIS — M62838 Other muscle spasm: Secondary | ICD-10-CM | POA: Diagnosis not present

## 2017-04-13 DIAGNOSIS — N393 Stress incontinence (female) (male): Secondary | ICD-10-CM | POA: Diagnosis not present

## 2017-04-13 DIAGNOSIS — R102 Pelvic and perineal pain: Secondary | ICD-10-CM | POA: Diagnosis not present

## 2017-04-13 DIAGNOSIS — M6281 Muscle weakness (generalized): Secondary | ICD-10-CM | POA: Diagnosis not present

## 2017-04-22 DIAGNOSIS — R5383 Other fatigue: Secondary | ICD-10-CM | POA: Diagnosis not present

## 2017-04-22 DIAGNOSIS — Z7189 Other specified counseling: Secondary | ICD-10-CM | POA: Diagnosis not present

## 2017-04-22 DIAGNOSIS — Z1211 Encounter for screening for malignant neoplasm of colon: Secondary | ICD-10-CM | POA: Diagnosis not present

## 2017-04-22 DIAGNOSIS — Z79899 Other long term (current) drug therapy: Secondary | ICD-10-CM | POA: Diagnosis not present

## 2017-04-22 DIAGNOSIS — C61 Malignant neoplasm of prostate: Secondary | ICD-10-CM | POA: Diagnosis not present

## 2017-04-22 DIAGNOSIS — Z0000 Encounter for general adult medical examination without abnormal findings: Secondary | ICD-10-CM | POA: Diagnosis not present

## 2017-04-22 DIAGNOSIS — Z299 Encounter for prophylactic measures, unspecified: Secondary | ICD-10-CM | POA: Diagnosis not present

## 2017-04-22 DIAGNOSIS — J61 Pneumoconiosis due to asbestos and other mineral fibers: Secondary | ICD-10-CM | POA: Diagnosis not present

## 2017-04-22 DIAGNOSIS — E7800 Pure hypercholesterolemia, unspecified: Secondary | ICD-10-CM | POA: Diagnosis not present

## 2017-04-22 DIAGNOSIS — Z1389 Encounter for screening for other disorder: Secondary | ICD-10-CM | POA: Diagnosis not present

## 2017-04-22 DIAGNOSIS — Z6823 Body mass index (BMI) 23.0-23.9, adult: Secondary | ICD-10-CM | POA: Diagnosis not present

## 2017-04-22 DIAGNOSIS — I340 Nonrheumatic mitral (valve) insufficiency: Secondary | ICD-10-CM | POA: Diagnosis not present

## 2017-04-27 DIAGNOSIS — R102 Pelvic and perineal pain: Secondary | ICD-10-CM | POA: Diagnosis not present

## 2017-04-27 DIAGNOSIS — M62838 Other muscle spasm: Secondary | ICD-10-CM | POA: Diagnosis not present

## 2017-04-27 DIAGNOSIS — N393 Stress incontinence (female) (male): Secondary | ICD-10-CM | POA: Diagnosis not present

## 2017-04-27 DIAGNOSIS — M6281 Muscle weakness (generalized): Secondary | ICD-10-CM | POA: Diagnosis not present

## 2017-05-04 DIAGNOSIS — H35373 Puckering of macula, bilateral: Secondary | ICD-10-CM | POA: Diagnosis not present

## 2017-05-04 DIAGNOSIS — H35411 Lattice degeneration of retina, right eye: Secondary | ICD-10-CM | POA: Diagnosis not present

## 2017-05-04 DIAGNOSIS — H43393 Other vitreous opacities, bilateral: Secondary | ICD-10-CM | POA: Diagnosis not present

## 2017-05-04 DIAGNOSIS — H43813 Vitreous degeneration, bilateral: Secondary | ICD-10-CM | POA: Diagnosis not present

## 2017-05-11 DIAGNOSIS — M62838 Other muscle spasm: Secondary | ICD-10-CM | POA: Diagnosis not present

## 2017-05-11 DIAGNOSIS — N393 Stress incontinence (female) (male): Secondary | ICD-10-CM | POA: Diagnosis not present

## 2017-05-11 DIAGNOSIS — R102 Pelvic and perineal pain: Secondary | ICD-10-CM | POA: Diagnosis not present

## 2017-05-11 DIAGNOSIS — M6281 Muscle weakness (generalized): Secondary | ICD-10-CM | POA: Diagnosis not present

## 2017-05-18 DIAGNOSIS — M62838 Other muscle spasm: Secondary | ICD-10-CM | POA: Diagnosis not present

## 2017-05-18 DIAGNOSIS — R102 Pelvic and perineal pain: Secondary | ICD-10-CM | POA: Diagnosis not present

## 2017-05-18 DIAGNOSIS — M6281 Muscle weakness (generalized): Secondary | ICD-10-CM | POA: Diagnosis not present

## 2017-05-18 DIAGNOSIS — N393 Stress incontinence (female) (male): Secondary | ICD-10-CM | POA: Diagnosis not present

## 2017-05-27 DIAGNOSIS — N393 Stress incontinence (female) (male): Secondary | ICD-10-CM | POA: Diagnosis not present

## 2017-05-27 DIAGNOSIS — C61 Malignant neoplasm of prostate: Secondary | ICD-10-CM | POA: Diagnosis not present

## 2017-05-27 DIAGNOSIS — M62838 Other muscle spasm: Secondary | ICD-10-CM | POA: Diagnosis not present

## 2017-05-27 DIAGNOSIS — M6281 Muscle weakness (generalized): Secondary | ICD-10-CM | POA: Diagnosis not present

## 2017-05-27 DIAGNOSIS — R102 Pelvic and perineal pain: Secondary | ICD-10-CM | POA: Diagnosis not present

## 2017-06-03 DIAGNOSIS — N5201 Erectile dysfunction due to arterial insufficiency: Secondary | ICD-10-CM | POA: Diagnosis not present

## 2017-06-03 DIAGNOSIS — N393 Stress incontinence (female) (male): Secondary | ICD-10-CM | POA: Diagnosis not present

## 2017-06-03 DIAGNOSIS — C61 Malignant neoplasm of prostate: Secondary | ICD-10-CM | POA: Diagnosis not present

## 2017-06-16 DIAGNOSIS — M62838 Other muscle spasm: Secondary | ICD-10-CM | POA: Diagnosis not present

## 2017-06-16 DIAGNOSIS — N393 Stress incontinence (female) (male): Secondary | ICD-10-CM | POA: Diagnosis not present

## 2017-06-16 DIAGNOSIS — M6281 Muscle weakness (generalized): Secondary | ICD-10-CM | POA: Diagnosis not present

## 2017-06-16 DIAGNOSIS — R102 Pelvic and perineal pain: Secondary | ICD-10-CM | POA: Diagnosis not present

## 2017-06-23 DIAGNOSIS — R102 Pelvic and perineal pain: Secondary | ICD-10-CM | POA: Diagnosis not present

## 2017-06-23 DIAGNOSIS — N393 Stress incontinence (female) (male): Secondary | ICD-10-CM | POA: Diagnosis not present

## 2017-06-23 DIAGNOSIS — M6281 Muscle weakness (generalized): Secondary | ICD-10-CM | POA: Diagnosis not present

## 2017-06-23 DIAGNOSIS — M62838 Other muscle spasm: Secondary | ICD-10-CM | POA: Diagnosis not present

## 2017-07-06 DIAGNOSIS — R102 Pelvic and perineal pain: Secondary | ICD-10-CM | POA: Diagnosis not present

## 2017-07-06 DIAGNOSIS — N393 Stress incontinence (female) (male): Secondary | ICD-10-CM | POA: Diagnosis not present

## 2017-07-06 DIAGNOSIS — M62838 Other muscle spasm: Secondary | ICD-10-CM | POA: Diagnosis not present

## 2017-07-06 DIAGNOSIS — M6281 Muscle weakness (generalized): Secondary | ICD-10-CM | POA: Diagnosis not present

## 2017-07-07 DIAGNOSIS — H43813 Vitreous degeneration, bilateral: Secondary | ICD-10-CM | POA: Diagnosis not present

## 2017-07-07 DIAGNOSIS — H26491 Other secondary cataract, right eye: Secondary | ICD-10-CM | POA: Diagnosis not present

## 2017-07-07 DIAGNOSIS — H524 Presbyopia: Secondary | ICD-10-CM | POA: Diagnosis not present

## 2017-07-07 DIAGNOSIS — H35373 Puckering of macula, bilateral: Secondary | ICD-10-CM | POA: Diagnosis not present

## 2017-07-21 DIAGNOSIS — H26492 Other secondary cataract, left eye: Secondary | ICD-10-CM | POA: Diagnosis not present

## 2017-07-26 DIAGNOSIS — M6281 Muscle weakness (generalized): Secondary | ICD-10-CM | POA: Diagnosis not present

## 2017-07-26 DIAGNOSIS — N393 Stress incontinence (female) (male): Secondary | ICD-10-CM | POA: Diagnosis not present

## 2017-07-26 DIAGNOSIS — R102 Pelvic and perineal pain: Secondary | ICD-10-CM | POA: Diagnosis not present

## 2017-07-26 DIAGNOSIS — M62838 Other muscle spasm: Secondary | ICD-10-CM | POA: Diagnosis not present

## 2017-08-17 DIAGNOSIS — R102 Pelvic and perineal pain: Secondary | ICD-10-CM | POA: Diagnosis not present

## 2017-08-17 DIAGNOSIS — M62838 Other muscle spasm: Secondary | ICD-10-CM | POA: Diagnosis not present

## 2017-08-17 DIAGNOSIS — N393 Stress incontinence (female) (male): Secondary | ICD-10-CM | POA: Diagnosis not present

## 2017-08-17 DIAGNOSIS — M6281 Muscle weakness (generalized): Secondary | ICD-10-CM | POA: Diagnosis not present

## 2017-09-06 HISTORY — PX: EYE SURGERY: SHX253

## 2017-09-08 DIAGNOSIS — R102 Pelvic and perineal pain: Secondary | ICD-10-CM | POA: Diagnosis not present

## 2017-09-08 DIAGNOSIS — M62838 Other muscle spasm: Secondary | ICD-10-CM | POA: Diagnosis not present

## 2017-09-08 DIAGNOSIS — N393 Stress incontinence (female) (male): Secondary | ICD-10-CM | POA: Diagnosis not present

## 2017-09-08 DIAGNOSIS — M6281 Muscle weakness (generalized): Secondary | ICD-10-CM | POA: Diagnosis not present

## 2017-09-27 DIAGNOSIS — M62838 Other muscle spasm: Secondary | ICD-10-CM | POA: Diagnosis not present

## 2017-09-27 DIAGNOSIS — N393 Stress incontinence (female) (male): Secondary | ICD-10-CM | POA: Diagnosis not present

## 2017-09-27 DIAGNOSIS — M6281 Muscle weakness (generalized): Secondary | ICD-10-CM | POA: Diagnosis not present

## 2017-09-27 DIAGNOSIS — R102 Pelvic and perineal pain: Secondary | ICD-10-CM | POA: Diagnosis not present

## 2017-10-10 DIAGNOSIS — I77810 Thoracic aortic ectasia: Secondary | ICD-10-CM | POA: Diagnosis not present

## 2017-10-10 DIAGNOSIS — I361 Nonrheumatic tricuspid (valve) insufficiency: Secondary | ICD-10-CM | POA: Diagnosis not present

## 2017-10-26 DIAGNOSIS — M6281 Muscle weakness (generalized): Secondary | ICD-10-CM | POA: Diagnosis not present

## 2017-10-26 DIAGNOSIS — R102 Pelvic and perineal pain: Secondary | ICD-10-CM | POA: Diagnosis not present

## 2017-10-26 DIAGNOSIS — M62838 Other muscle spasm: Secondary | ICD-10-CM | POA: Diagnosis not present

## 2017-10-26 DIAGNOSIS — N393 Stress incontinence (female) (male): Secondary | ICD-10-CM | POA: Diagnosis not present

## 2017-10-28 DIAGNOSIS — Z789 Other specified health status: Secondary | ICD-10-CM | POA: Diagnosis not present

## 2017-10-28 DIAGNOSIS — H538 Other visual disturbances: Secondary | ICD-10-CM | POA: Diagnosis not present

## 2017-10-28 DIAGNOSIS — R5383 Other fatigue: Secondary | ICD-10-CM | POA: Diagnosis not present

## 2017-10-28 DIAGNOSIS — C61 Malignant neoplasm of prostate: Secondary | ICD-10-CM | POA: Diagnosis not present

## 2017-10-28 DIAGNOSIS — Z299 Encounter for prophylactic measures, unspecified: Secondary | ICD-10-CM | POA: Diagnosis not present

## 2017-10-28 DIAGNOSIS — E7800 Pure hypercholesterolemia, unspecified: Secondary | ICD-10-CM | POA: Diagnosis not present

## 2017-10-28 DIAGNOSIS — R109 Unspecified abdominal pain: Secondary | ICD-10-CM | POA: Diagnosis not present

## 2017-10-28 DIAGNOSIS — Z6823 Body mass index (BMI) 23.0-23.9, adult: Secondary | ICD-10-CM | POA: Diagnosis not present

## 2017-10-28 DIAGNOSIS — G459 Transient cerebral ischemic attack, unspecified: Secondary | ICD-10-CM | POA: Diagnosis not present

## 2017-10-28 DIAGNOSIS — J61 Pneumoconiosis due to asbestos and other mineral fibers: Secondary | ICD-10-CM | POA: Diagnosis not present

## 2017-11-03 DIAGNOSIS — R5383 Other fatigue: Secondary | ICD-10-CM | POA: Diagnosis not present

## 2017-11-03 DIAGNOSIS — H538 Other visual disturbances: Secondary | ICD-10-CM | POA: Diagnosis not present

## 2017-11-04 DIAGNOSIS — H35373 Puckering of macula, bilateral: Secondary | ICD-10-CM | POA: Diagnosis not present

## 2017-11-04 DIAGNOSIS — H43393 Other vitreous opacities, bilateral: Secondary | ICD-10-CM | POA: Diagnosis not present

## 2017-11-04 DIAGNOSIS — H35413 Lattice degeneration of retina, bilateral: Secondary | ICD-10-CM | POA: Diagnosis not present

## 2017-11-04 DIAGNOSIS — H43813 Vitreous degeneration, bilateral: Secondary | ICD-10-CM | POA: Diagnosis not present

## 2017-11-07 DIAGNOSIS — R1031 Right lower quadrant pain: Secondary | ICD-10-CM | POA: Diagnosis not present

## 2017-11-07 DIAGNOSIS — I700 Atherosclerosis of aorta: Secondary | ICD-10-CM | POA: Diagnosis not present

## 2017-11-07 DIAGNOSIS — G459 Transient cerebral ischemic attack, unspecified: Secondary | ICD-10-CM | POA: Diagnosis not present

## 2017-11-08 DIAGNOSIS — Z961 Presence of intraocular lens: Secondary | ICD-10-CM | POA: Diagnosis not present

## 2017-11-08 DIAGNOSIS — H5034 Intermittent alternating exotropia: Secondary | ICD-10-CM | POA: Diagnosis not present

## 2017-11-08 DIAGNOSIS — H35379 Puckering of macula, unspecified eye: Secondary | ICD-10-CM | POA: Diagnosis not present

## 2017-11-30 DIAGNOSIS — M6281 Muscle weakness (generalized): Secondary | ICD-10-CM | POA: Diagnosis not present

## 2017-11-30 DIAGNOSIS — M62838 Other muscle spasm: Secondary | ICD-10-CM | POA: Diagnosis not present

## 2017-11-30 DIAGNOSIS — C61 Malignant neoplasm of prostate: Secondary | ICD-10-CM | POA: Diagnosis not present

## 2017-11-30 DIAGNOSIS — N393 Stress incontinence (female) (male): Secondary | ICD-10-CM | POA: Diagnosis not present

## 2017-11-30 DIAGNOSIS — R102 Pelvic and perineal pain: Secondary | ICD-10-CM | POA: Diagnosis not present

## 2017-12-07 DIAGNOSIS — N393 Stress incontinence (female) (male): Secondary | ICD-10-CM | POA: Diagnosis not present

## 2017-12-07 DIAGNOSIS — C61 Malignant neoplasm of prostate: Secondary | ICD-10-CM | POA: Diagnosis not present

## 2017-12-19 ENCOUNTER — Encounter: Payer: Self-pay | Admitting: *Deleted

## 2017-12-19 ENCOUNTER — Ambulatory Visit (INDEPENDENT_AMBULATORY_CARE_PROVIDER_SITE_OTHER): Payer: Medicare Other | Admitting: Neurology

## 2017-12-19 ENCOUNTER — Encounter: Payer: Self-pay | Admitting: Neurology

## 2017-12-19 ENCOUNTER — Other Ambulatory Visit: Payer: Self-pay | Admitting: *Deleted

## 2017-12-19 ENCOUNTER — Telehealth: Payer: Self-pay | Admitting: Neurology

## 2017-12-19 VITALS — BP 123/84 | HR 82 | Ht 71.0 in | Wt 161.0 lb

## 2017-12-19 DIAGNOSIS — R29898 Other symptoms and signs involving the musculoskeletal system: Secondary | ICD-10-CM

## 2017-12-19 DIAGNOSIS — R7309 Other abnormal glucose: Secondary | ICD-10-CM | POA: Diagnosis not present

## 2017-12-19 DIAGNOSIS — H5461 Unqualified visual loss, right eye, normal vision left eye: Secondary | ICD-10-CM

## 2017-12-19 DIAGNOSIS — G259 Extrapyramidal and movement disorder, unspecified: Secondary | ICD-10-CM | POA: Diagnosis not present

## 2017-12-19 DIAGNOSIS — H05111 Granuloma of right orbit: Secondary | ICD-10-CM

## 2017-12-19 DIAGNOSIS — R259 Unspecified abnormal involuntary movements: Secondary | ICD-10-CM

## 2017-12-19 DIAGNOSIS — H499 Unspecified paralytic strabismus: Secondary | ICD-10-CM | POA: Diagnosis not present

## 2017-12-19 DIAGNOSIS — G459 Transient cerebral ischemic attack, unspecified: Secondary | ICD-10-CM

## 2017-12-19 DIAGNOSIS — R278 Other lack of coordination: Secondary | ICD-10-CM

## 2017-12-19 DIAGNOSIS — H532 Diplopia: Secondary | ICD-10-CM | POA: Diagnosis not present

## 2017-12-19 MED ORDER — ASPIRIN EC 325 MG PO TBEC: 325.0000 mg | DELAYED_RELEASE_TABLET | Freq: Every day | ORAL | 0 refills | Status: DC

## 2017-12-19 MED ORDER — ALPRAZOLAM 0.25 MG PO TABS: ORAL_TABLET | ORAL | 0 refills | Status: DC

## 2017-12-19 NOTE — Patient Instructions (Addendum)
MRI brain, orbits with contrast and MRA head looking at the blood vessels Labs today 30-day heart monitor Start ASA daily   Stroke Prevention Some health problems and behaviors may make it more likely for you to have a stroke. Below are ways to lessen your risk of having a stroke.  Be active for at least 30 minutes on most or all days.  Do not smoke. Try not to be around others who smoke.  Do not drink too much alcohol. ? Do not have more than 2 drinks a day if you are a man. ? Do not have more than 1 drink a day if you are a woman and are not pregnant.  Eat healthy foods, such as fruits and vegetables. If you were put on a specific diet, follow the diet as told.  Keep your cholesterol levels under control through diet and medicines. Look for foods that are low in saturated fat, trans fat, cholesterol, and are high in fiber.  If you have diabetes, follow all diet plans and take your medicine as told.  Ask your doctor if you need treatment to lower your blood pressure. If you have high blood pressure (hypertension), follow all diet plans and take your medicine as told by your doctor.  If you are 40-68 years old, have your blood pressure checked every 3-5 years. If you are age 74 or older, have your blood pressure checked every year.  Keep a healthy weight. Eat foods that are low in calories, salt, saturated fat, trans fat, and cholesterol.  Do not take drugs.  Avoid birth control pills, if this applies. Talk to your doctor about the risks of taking birth control pills.  Talk to your doctor if you have sleep problems (sleep apnea).  Take all medicine as told by your doctor. ? You may be told to take aspirin or blood thinner medicine. Take this medicine as told by your doctor. ? Understand your medicine instructions.  Make sure any other conditions you have are being taken care of.  Get help right away if:  You suddenly lose feeling (you feel numb) or have weakness in your  face, arm, or leg.  Your face or eyelid hangs down to one side.  You suddenly feel confused.  You have trouble talking (aphasia) or understanding what people are saying.  You suddenly have trouble seeing in one or both eyes.  You suddenly have trouble walking.  You are dizzy.  You lose your balance or your movements are clumsy (uncoordinated).  You suddenly have a very bad headache and you do not know the cause.  You have new chest pain.  Your heart feels like it is fluttering or skipping a beat (irregular heartbeat). Do not wait to see if the symptoms above go away. Get help right away. Call your local emergency services (911 in [place] Do not drive yourself to the hospital. This information is not intended to replace advice given to you by your health care provider. Make sure you discuss any questions you have with your health care provider. Document Released: 02/22/2012 Document Revised: 01/29/2016 Document Reviewed: 02/23/2013 Elsevier Interactive Patient Education  [employer].
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
## 2017-12-19 NOTE — Telephone Encounter (Signed)
Faxed pt's signed Xanax prescription for MRI to pharmacy. Received a receipt of confirmation.

## 2017-12-19 NOTE — Telephone Encounter (Signed)
Medicare/aarp order sent to GI. No auth they will reach out to the patient to schedule.  

## 2017-12-20 ENCOUNTER — Ambulatory Visit (INDEPENDENT_AMBULATORY_CARE_PROVIDER_SITE_OTHER): Payer: Medicare Other

## 2017-12-20 ENCOUNTER — Telehealth: Payer: Self-pay | Admitting: *Deleted

## 2017-12-20 ENCOUNTER — Other Ambulatory Visit: Payer: Self-pay | Admitting: Neurology

## 2017-12-20 DIAGNOSIS — R29898 Other symptoms and signs involving the musculoskeletal system: Secondary | ICD-10-CM

## 2017-12-20 DIAGNOSIS — G259 Extrapyramidal and movement disorder, unspecified: Secondary | ICD-10-CM | POA: Diagnosis not present

## 2017-12-20 DIAGNOSIS — H5461 Unqualified visual loss, right eye, normal vision left eye: Secondary | ICD-10-CM

## 2017-12-20 DIAGNOSIS — G459 Transient cerebral ischemic attack, unspecified: Secondary | ICD-10-CM

## 2017-12-20 DIAGNOSIS — I4891 Unspecified atrial fibrillation: Secondary | ICD-10-CM

## 2017-12-20 DIAGNOSIS — R278 Other lack of coordination: Secondary | ICD-10-CM | POA: Diagnosis not present

## 2017-12-20 DIAGNOSIS — H05111 Granuloma of right orbit: Secondary | ICD-10-CM | POA: Diagnosis not present

## 2017-12-20 DIAGNOSIS — R002 Palpitations: Secondary | ICD-10-CM | POA: Diagnosis not present

## 2017-12-20 DIAGNOSIS — R259 Unspecified abnormal involuntary movements: Secondary | ICD-10-CM

## 2017-12-20 LAB — CBC
Hematocrit: 47.2 % (ref 37.5–51.0)
Hemoglobin: 16.2 g/dL (ref 13.0–17.7)
MCH: 31.6 pg (ref 26.6–33.0)
MCHC: 34.3 g/dL (ref 31.5–35.7)
MCV: 92 fL (ref 79–97)
PLATELETS: 226 10*3/uL (ref 150–379)
RBC: 5.13 x10E6/uL (ref 4.14–5.80)
RDW: 14.2 % (ref 12.3–15.4)
WBC: 7.6 10*3/uL (ref 3.4–10.8)

## 2017-12-20 LAB — COMPREHENSIVE METABOLIC PANEL
ALBUMIN: 4.5 g/dL (ref 3.6–4.8)
ALT: 27 IU/L (ref 0–44)
AST: 22 IU/L (ref 0–40)
Albumin/Globulin Ratio: 1.9 (ref 1.2–2.2)
Alkaline Phosphatase: 67 IU/L (ref 39–117)
BUN / CREAT RATIO: 14 (ref 10–24)
BUN: 15 mg/dL (ref 8–27)
Bilirubin Total: 0.9 mg/dL (ref 0.0–1.2)
CALCIUM: 9.5 mg/dL (ref 8.6–10.2)
CO2: 22 mmol/L (ref 20–29)
CREATININE: 1.11 mg/dL (ref 0.76–1.27)
Chloride: 104 mmol/L (ref 96–106)
GFR calc Af Amer: 80 mL/min/{1.73_m2} (ref >59)
GFR, EST NON AFRICAN AMERICAN: 69 mL/min/{1.73_m2} (ref >59)
GLOBULIN, TOTAL: 2.4 g/dL (ref 1.5–4.5)
Glucose: 88 mg/dL (ref 65–99)
Potassium: 4.7 mmol/L (ref 3.5–5.2)
SODIUM: 139 mmol/L (ref 134–144)
TOTAL PROTEIN: 6.9 g/dL (ref 6.0–8.5)

## 2017-12-20 LAB — LIPID PANEL
Chol/HDL Ratio: 4.1 ratio (ref 0.0–5.0)
Cholesterol, Total: 210 mg/dL — ABNORMAL HIGH (ref 100–199)
HDL: 51 mg/dL (ref >39)
LDL Calculated: 136 mg/dL — ABNORMAL HIGH (ref 0–99)
Triglycerides: 117 mg/dL (ref 0–149)
VLDL Cholesterol Cal: 23 mg/dL (ref 5–40)

## 2017-12-20 LAB — HEMOGLOBIN A1C
ESTIMATED AVERAGE GLUCOSE: 117 mg/dL
Hgb A1c MFr Bld: 5.7 % — ABNORMAL HIGH (ref 4.8–5.6)

## 2017-12-20 NOTE — Telephone Encounter (Signed)
-----   Message from Melvenia Beam, MD sent at 12/20/2017  8:42 AM EDT ----- His cholesterol is elevated at 210. Hid LDL is 136 and should be closer to 70. He should follow upo with his primary care and consider statin therapy like Lipitor. Also, he is pre-diabetic. thanks

## 2017-12-20 NOTE — Telephone Encounter (Signed)
Spoke with patient regarding lab results. Discussed that his cholesterol is elevated at 210. We like for it to be less than 200. His LDL is 136 and should be closer to 70. Advised pt that Dr. Jaynee Eagles wants him to follow up with his primary care and consider statin therapy like Lipitor. Also, he is pre-diabetic. Hgb A1C is 5.7 Encouraged pt to watch his carb and sugar intake and f/u with his PCP as well so this does not turn into Diabetes. The patient verbalized understanding and appreciation of all. He would like to finish the testing that is currently scheduled before he sees his PCP again but he will be seeing him. He had no further concerns or questions.

## 2018-01-03 ENCOUNTER — Ambulatory Visit
Admission: RE | Admit: 2018-01-03 | Discharge: 2018-01-03 | Disposition: A | Discharge status: Home / Self Care | Payer: Medicare Other | Source: Ambulatory Visit | Attending: Neurology | Admitting: Neurology

## 2018-01-03 DIAGNOSIS — H5461 Unqualified visual loss, right eye, normal vision left eye: Secondary | ICD-10-CM

## 2018-01-03 DIAGNOSIS — R278 Other lack of coordination: Secondary | ICD-10-CM

## 2018-01-03 DIAGNOSIS — H499 Unspecified paralytic strabismus: Secondary | ICD-10-CM

## 2018-01-03 DIAGNOSIS — R29898 Other symptoms and signs involving the musculoskeletal system: Secondary | ICD-10-CM

## 2018-01-03 DIAGNOSIS — H05111 Granuloma of right orbit: Secondary | ICD-10-CM

## 2018-01-03 DIAGNOSIS — G259 Extrapyramidal and movement disorder, unspecified: Secondary | ICD-10-CM

## 2018-01-03 DIAGNOSIS — H532 Diplopia: Secondary | ICD-10-CM | POA: Diagnosis not present

## 2018-01-03 DIAGNOSIS — R259 Unspecified abnormal involuntary movements: Secondary | ICD-10-CM

## 2018-01-03 MED ORDER — GADOBENATE DIMEGLUMINE 529 MG/ML IV SOLN
15.0000 mL | Freq: Once | INTRAVENOUS | Status: AC | PRN
  Administered 2018-01-03: 15 mL via INTRAVENOUS

## 2018-01-05 ENCOUNTER — Telehealth: Payer: Self-pay | Admitting: *Deleted

## 2018-01-05 NOTE — Telephone Encounter (Addendum)
Spoke with patient. Discussed that his MRI brain, orbits and MRA were all normal. We did see some arthritis in his neck. Pt verbalized understating and appreciation for the call.   ----- Message from Melvenia Beam, MD sent at 01/04/2018  5:25 PM EDT ----- MRI brain, orbits, and MRA are normal. There was some incidental arthritis noted in the cervical spine. thanks

## 2018-01-19 DIAGNOSIS — M6281 Muscle weakness (generalized): Secondary | ICD-10-CM | POA: Diagnosis not present

## 2018-01-19 DIAGNOSIS — N393 Stress incontinence (female) (male): Secondary | ICD-10-CM | POA: Diagnosis not present

## 2018-01-19 DIAGNOSIS — M62838 Other muscle spasm: Secondary | ICD-10-CM | POA: Diagnosis not present

## 2018-01-19 DIAGNOSIS — R102 Pelvic and perineal pain: Secondary | ICD-10-CM | POA: Diagnosis not present

## 2018-01-26 ENCOUNTER — Telehealth: Payer: Self-pay | Admitting: *Deleted

## 2018-01-26 NOTE — Telephone Encounter (Signed)
-----   Message from Melvenia Beam, MD sent at 01/26/2018  1:46 PM EDT ----- No episodes of afib, unremarkable

## 2018-01-26 NOTE — Telephone Encounter (Signed)
Called and spoke with patient. He is aware that the results of his cardiac event monitor was unremarkable, no episodes of atrial fibrillation. The patient verbalized appreciation. Confirmed he will f/u in July with Hoyle Sauer.

## 2018-03-02 DIAGNOSIS — R102 Pelvic and perineal pain: Secondary | ICD-10-CM | POA: Diagnosis not present

## 2018-03-02 DIAGNOSIS — M62838 Other muscle spasm: Secondary | ICD-10-CM | POA: Diagnosis not present

## 2018-03-02 DIAGNOSIS — M6281 Muscle weakness (generalized): Secondary | ICD-10-CM | POA: Diagnosis not present

## 2018-03-02 DIAGNOSIS — N393 Stress incontinence (female) (male): Secondary | ICD-10-CM | POA: Diagnosis not present
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
## 2018-03-20 ENCOUNTER — Ambulatory Visit (INDEPENDENT_AMBULATORY_CARE_PROVIDER_SITE_OTHER): Payer: Medicare Other | Admitting: Nurse Practitioner

## 2018-03-20 ENCOUNTER — Encounter: Payer: Self-pay | Admitting: Nurse Practitioner

## 2018-03-20 DIAGNOSIS — H539 Unspecified visual disturbance: Secondary | ICD-10-CM | POA: Insufficient documentation

## 2018-03-20 NOTE — Patient Instructions (Signed)
Stressed the importance of management of risk factors to prevent  stroke Pt unable to take aspirin due to retinal tears Maintain strict control of hypertension with blood pressure goal below 130/90, today's reading 130/36 Control of diabetes with hemoglobin A1c below 6.5 followed by primary care  Cholesterol with LDL cholesterol less than 70, followed by primary care,  most recent 136 currently exercise and diet has appt with PCP for  statin therapy Exercise by walking, yoga etc  eat healthy diet with whole grains,  fresh fruits and vegetables Follow-up with primary care for stroke risk factor modification, maintain blood pressure goal less than 707 systolic, diabetes with A1H below 7, lipids with LDL below 70 Cardiac event monitor was negative for atrial fib MRI of the brain did not show acute stroke event.  MRA was negative Follow up in 6 months

## 2018-03-21 NOTE — Progress Notes (Signed)
Participated in, made any corrections needed, and agree with history, physical, neuro exam,assessment and plan as stated above.

## 2018-03-23 DIAGNOSIS — K5900 Constipation, unspecified: Secondary | ICD-10-CM | POA: Diagnosis not present

## 2018-03-23 DIAGNOSIS — R102 Pelvic and perineal pain: Secondary | ICD-10-CM | POA: Diagnosis not present

## 2018-03-23 DIAGNOSIS — M62838 Other muscle spasm: Secondary | ICD-10-CM | POA: Diagnosis not present

## 2018-03-23 DIAGNOSIS — M6281 Muscle weakness (generalized): Secondary | ICD-10-CM | POA: Diagnosis not present

## 2018-03-23 DIAGNOSIS — N393 Stress incontinence (female) (male): Secondary | ICD-10-CM | POA: Diagnosis not present

## 2018-03-24 DIAGNOSIS — H43393 Other vitreous opacities, bilateral: Secondary | ICD-10-CM | POA: Diagnosis not present

## 2018-03-24 DIAGNOSIS — H35373 Puckering of macula, bilateral: Secondary | ICD-10-CM | POA: Diagnosis not present

## 2018-03-24 DIAGNOSIS — H43812 Vitreous degeneration, left eye: Secondary | ICD-10-CM | POA: Diagnosis not present

## 2018-03-27 DIAGNOSIS — H35373 Puckering of macula, bilateral: Secondary | ICD-10-CM | POA: Diagnosis not present

## 2018-03-27 DIAGNOSIS — H33312 Horseshoe tear of retina without detachment, left eye: Secondary | ICD-10-CM | POA: Diagnosis not present

## 2018-03-27 DIAGNOSIS — H35372 Puckering of macula, left eye: Secondary | ICD-10-CM | POA: Diagnosis not present

## 2018-03-28 DIAGNOSIS — H35373 Puckering of macula, bilateral: Secondary | ICD-10-CM | POA: Diagnosis not present

## 2018-04-04 DIAGNOSIS — H35373 Puckering of macula, bilateral: Secondary | ICD-10-CM | POA: Diagnosis not present

## 2018-04-04 DIAGNOSIS — H3581 Retinal edema: Secondary | ICD-10-CM | POA: Diagnosis not present

## 2018-04-25 DIAGNOSIS — H35371 Puckering of macula, right eye: Secondary | ICD-10-CM | POA: Diagnosis not present

## 2018-04-25 DIAGNOSIS — H3581 Retinal edema: Secondary | ICD-10-CM | POA: Diagnosis not present

## 2018-04-28 DIAGNOSIS — E7800 Pure hypercholesterolemia, unspecified: Secondary | ICD-10-CM | POA: Diagnosis not present

## 2018-04-28 DIAGNOSIS — Z0000 Encounter for general adult medical examination without abnormal findings: Secondary | ICD-10-CM | POA: Diagnosis not present

## 2018-04-28 DIAGNOSIS — Z299 Encounter for prophylactic measures, unspecified: Secondary | ICD-10-CM | POA: Diagnosis not present

## 2018-04-28 DIAGNOSIS — Z7189 Other specified counseling: Secondary | ICD-10-CM | POA: Diagnosis not present

## 2018-04-28 DIAGNOSIS — R5383 Other fatigue: Secondary | ICD-10-CM | POA: Diagnosis not present

## 2018-04-28 DIAGNOSIS — C61 Malignant neoplasm of prostate: Secondary | ICD-10-CM | POA: Diagnosis not present

## 2018-04-28 DIAGNOSIS — Z1331 Encounter for screening for depression: Secondary | ICD-10-CM | POA: Diagnosis not present

## 2018-04-28 DIAGNOSIS — Z6822 Body mass index (BMI) 22.0-22.9, adult: Secondary | ICD-10-CM | POA: Diagnosis not present

## 2018-04-28 DIAGNOSIS — Z1211 Encounter for screening for malignant neoplasm of colon: Secondary | ICD-10-CM | POA: Diagnosis not present

## 2018-04-28 DIAGNOSIS — Z79899 Other long term (current) drug therapy: Secondary | ICD-10-CM | POA: Diagnosis not present

## 2018-04-28 DIAGNOSIS — Z1339 Encounter for screening examination for other mental health and behavioral disorders: Secondary | ICD-10-CM | POA: Diagnosis not present

## 2018-05-11 DIAGNOSIS — M62838 Other muscle spasm: Secondary | ICD-10-CM | POA: Diagnosis not present

## 2018-05-11 DIAGNOSIS — R102 Pelvic and perineal pain: Secondary | ICD-10-CM | POA: Diagnosis not present

## 2018-05-11 DIAGNOSIS — K5900 Constipation, unspecified: Secondary | ICD-10-CM | POA: Diagnosis not present

## 2018-05-11 DIAGNOSIS — N393 Stress incontinence (female) (male): Secondary | ICD-10-CM | POA: Diagnosis not present

## 2018-05-11 DIAGNOSIS — M6281 Muscle weakness (generalized): Secondary | ICD-10-CM | POA: Diagnosis not present

## 2018-05-23 DIAGNOSIS — H3581 Retinal edema: Secondary | ICD-10-CM | POA: Diagnosis not present

## 2018-05-23 DIAGNOSIS — H35373 Puckering of macula, bilateral: Secondary | ICD-10-CM | POA: Diagnosis not present

## 2018-05-25 DIAGNOSIS — N393 Stress incontinence (female) (male): Secondary | ICD-10-CM | POA: Diagnosis not present

## 2018-05-25 DIAGNOSIS — R102 Pelvic and perineal pain: Secondary | ICD-10-CM | POA: Diagnosis not present

## 2018-05-25 DIAGNOSIS — M62838 Other muscle spasm: Secondary | ICD-10-CM | POA: Diagnosis not present

## 2018-05-25 DIAGNOSIS — M6281 Muscle weakness (generalized): Secondary | ICD-10-CM | POA: Diagnosis not present

## 2018-05-29 DIAGNOSIS — N393 Stress incontinence (female) (male): Secondary | ICD-10-CM | POA: Diagnosis not present

## 2018-05-29 DIAGNOSIS — M62838 Other muscle spasm: Secondary | ICD-10-CM | POA: Diagnosis not present

## 2018-05-29 DIAGNOSIS — R102 Pelvic and perineal pain: Secondary | ICD-10-CM | POA: Diagnosis not present

## 2018-05-29 DIAGNOSIS — M6281 Muscle weakness (generalized): Secondary | ICD-10-CM | POA: Diagnosis not present

## 2018-06-02 DIAGNOSIS — M6281 Muscle weakness (generalized): Secondary | ICD-10-CM | POA: Diagnosis not present

## 2018-06-02 DIAGNOSIS — K5900 Constipation, unspecified: Secondary | ICD-10-CM | POA: Diagnosis not present

## 2018-06-02 DIAGNOSIS — M62838 Other muscle spasm: Secondary | ICD-10-CM | POA: Diagnosis not present

## 2018-06-02 DIAGNOSIS — R102 Pelvic and perineal pain: Secondary | ICD-10-CM | POA: Diagnosis not present

## 2018-06-02 DIAGNOSIS — N393 Stress incontinence (female) (male): Secondary | ICD-10-CM | POA: Diagnosis not present

## 2018-06-08 DIAGNOSIS — M62838 Other muscle spasm: Secondary | ICD-10-CM | POA: Diagnosis not present

## 2018-06-08 DIAGNOSIS — R102 Pelvic and perineal pain: Secondary | ICD-10-CM | POA: Diagnosis not present

## 2018-06-08 DIAGNOSIS — K5900 Constipation, unspecified: Secondary | ICD-10-CM | POA: Diagnosis not present

## 2018-06-08 DIAGNOSIS — N393 Stress incontinence (female) (male): Secondary | ICD-10-CM | POA: Diagnosis not present

## 2018-06-08 DIAGNOSIS — M6281 Muscle weakness (generalized): Secondary | ICD-10-CM | POA: Diagnosis not present

## 2018-06-12 DIAGNOSIS — R102 Pelvic and perineal pain: Secondary | ICD-10-CM | POA: Diagnosis not present

## 2018-06-12 DIAGNOSIS — M62838 Other muscle spasm: Secondary | ICD-10-CM | POA: Diagnosis not present

## 2018-06-12 DIAGNOSIS — M6281 Muscle weakness (generalized): Secondary | ICD-10-CM | POA: Diagnosis not present

## 2018-06-12 DIAGNOSIS — N393 Stress incontinence (female) (male): Secondary | ICD-10-CM | POA: Diagnosis not present

## 2018-06-12 DIAGNOSIS — K5900 Constipation, unspecified: Secondary | ICD-10-CM | POA: Diagnosis not present

## 2018-06-22 DIAGNOSIS — R102 Pelvic and perineal pain: Secondary | ICD-10-CM | POA: Diagnosis not present

## 2018-06-22 DIAGNOSIS — N393 Stress incontinence (female) (male): Secondary | ICD-10-CM | POA: Diagnosis not present

## 2018-06-22 DIAGNOSIS — M62838 Other muscle spasm: Secondary | ICD-10-CM | POA: Diagnosis not present

## 2018-06-22 DIAGNOSIS — M6281 Muscle weakness (generalized): Secondary | ICD-10-CM | POA: Diagnosis not present

## 2018-06-28 DIAGNOSIS — M6281 Muscle weakness (generalized): Secondary | ICD-10-CM | POA: Diagnosis not present

## 2018-06-28 DIAGNOSIS — N393 Stress incontinence (female) (male): Secondary | ICD-10-CM | POA: Diagnosis not present

## 2018-06-28 DIAGNOSIS — M62838 Other muscle spasm: Secondary | ICD-10-CM | POA: Diagnosis not present

## 2018-06-28 DIAGNOSIS — K5900 Constipation, unspecified: Secondary | ICD-10-CM | POA: Diagnosis not present

## 2018-06-28 DIAGNOSIS — R102 Pelvic and perineal pain: Secondary | ICD-10-CM | POA: Diagnosis not present

## 2018-07-07 DIAGNOSIS — C61 Malignant neoplasm of prostate: Secondary | ICD-10-CM | POA: Diagnosis not present

## 2018-07-07 DIAGNOSIS — M62838 Other muscle spasm: Secondary | ICD-10-CM | POA: Diagnosis not present

## 2018-07-07 DIAGNOSIS — R102 Pelvic and perineal pain: Secondary | ICD-10-CM | POA: Diagnosis not present

## 2018-07-07 DIAGNOSIS — M6281 Muscle weakness (generalized): Secondary | ICD-10-CM | POA: Diagnosis not present

## 2018-07-07 DIAGNOSIS — N393 Stress incontinence (female) (male): Secondary | ICD-10-CM | POA: Diagnosis not present

## 2018-07-10 DIAGNOSIS — Z23 Encounter for immunization: Secondary | ICD-10-CM | POA: Diagnosis not present

## 2018-07-11 DIAGNOSIS — M62838 Other muscle spasm: Secondary | ICD-10-CM | POA: Diagnosis not present

## 2018-07-11 DIAGNOSIS — N393 Stress incontinence (female) (male): Secondary | ICD-10-CM | POA: Diagnosis not present

## 2018-07-11 DIAGNOSIS — M6281 Muscle weakness (generalized): Secondary | ICD-10-CM | POA: Diagnosis not present

## 2018-07-11 DIAGNOSIS — R102 Pelvic and perineal pain: Secondary | ICD-10-CM | POA: Diagnosis not present

## 2018-07-14 DIAGNOSIS — N393 Stress incontinence (female) (male): Secondary | ICD-10-CM | POA: Diagnosis not present

## 2018-07-14 DIAGNOSIS — C61 Malignant neoplasm of prostate: Secondary | ICD-10-CM | POA: Diagnosis not present

## 2018-07-27 DIAGNOSIS — R102 Pelvic and perineal pain: Secondary | ICD-10-CM | POA: Diagnosis not present

## 2018-07-27 DIAGNOSIS — N393 Stress incontinence (female) (male): Secondary | ICD-10-CM | POA: Diagnosis not present

## 2018-07-27 DIAGNOSIS — M62838 Other muscle spasm: Secondary | ICD-10-CM | POA: Diagnosis not present

## 2018-07-27 DIAGNOSIS — M6281 Muscle weakness (generalized): Secondary | ICD-10-CM | POA: Diagnosis not present

## 2018-08-02 DIAGNOSIS — M62838 Other muscle spasm: Secondary | ICD-10-CM | POA: Diagnosis not present

## 2018-08-02 DIAGNOSIS — N393 Stress incontinence (female) (male): Secondary | ICD-10-CM | POA: Diagnosis not present

## 2018-08-02 DIAGNOSIS — M6281 Muscle weakness (generalized): Secondary | ICD-10-CM | POA: Diagnosis not present

## 2018-08-02 DIAGNOSIS — R102 Pelvic and perineal pain: Secondary | ICD-10-CM | POA: Diagnosis not present

## 2018-08-10 DIAGNOSIS — H35371 Puckering of macula, right eye: Secondary | ICD-10-CM | POA: Diagnosis not present

## 2018-08-10 DIAGNOSIS — H43813 Vitreous degeneration, bilateral: Secondary | ICD-10-CM | POA: Diagnosis not present

## 2018-08-10 DIAGNOSIS — H524 Presbyopia: Secondary | ICD-10-CM | POA: Diagnosis not present

## 2018-08-17 DIAGNOSIS — M6281 Muscle weakness (generalized): Secondary | ICD-10-CM | POA: Diagnosis not present

## 2018-08-17 DIAGNOSIS — M62838 Other muscle spasm: Secondary | ICD-10-CM | POA: Diagnosis not present

## 2018-08-17 DIAGNOSIS — R102 Pelvic and perineal pain: Secondary | ICD-10-CM | POA: Diagnosis not present

## 2018-08-17 DIAGNOSIS — N393 Stress incontinence (female) (male): Secondary | ICD-10-CM | POA: Diagnosis not present

## 2018-08-22 DIAGNOSIS — H35371 Puckering of macula, right eye: Secondary | ICD-10-CM | POA: Diagnosis not present

## 2018-08-22 DIAGNOSIS — H43811 Vitreous degeneration, right eye: Secondary | ICD-10-CM | POA: Diagnosis not present

## 2018-08-22 DIAGNOSIS — H35411 Lattice degeneration of retina, right eye: Secondary | ICD-10-CM | POA: Diagnosis not present

## 2018-08-22 DIAGNOSIS — H43391 Other vitreous opacities, right eye: Secondary | ICD-10-CM | POA: Diagnosis not present

## 2018-08-24 DIAGNOSIS — N393 Stress incontinence (female) (male): Secondary | ICD-10-CM | POA: Diagnosis not present

## 2018-08-24 DIAGNOSIS — M62838 Other muscle spasm: Secondary | ICD-10-CM | POA: Diagnosis not present

## 2018-08-24 DIAGNOSIS — R102 Pelvic and perineal pain: Secondary | ICD-10-CM | POA: Diagnosis not present

## 2018-08-24 DIAGNOSIS — M6281 Muscle weakness (generalized): Secondary | ICD-10-CM | POA: Diagnosis not present

## 2018-09-01 DIAGNOSIS — N393 Stress incontinence (female) (male): Secondary | ICD-10-CM | POA: Diagnosis not present

## 2018-09-01 DIAGNOSIS — M62838 Other muscle spasm: Secondary | ICD-10-CM | POA: Diagnosis not present

## 2018-09-01 DIAGNOSIS — M6281 Muscle weakness (generalized): Secondary | ICD-10-CM | POA: Diagnosis not present

## 2018-09-01 DIAGNOSIS — R102 Pelvic and perineal pain: Secondary | ICD-10-CM | POA: Diagnosis not present

## 2018-09-06 DIAGNOSIS — H547 Unspecified visual loss: Secondary | ICD-10-CM

## 2018-09-06 HISTORY — DX: Unspecified visual loss: H54.7

## 2018-09-11 DIAGNOSIS — H35371 Puckering of macula, right eye: Secondary | ICD-10-CM | POA: Diagnosis not present

## 2018-09-11 DIAGNOSIS — H33331 Multiple defects of retina without detachment, right eye: Secondary | ICD-10-CM | POA: Diagnosis not present

## 2018-09-12 DIAGNOSIS — H35371 Puckering of macula, right eye: Secondary | ICD-10-CM | POA: Diagnosis not present

## 2018-09-12 DIAGNOSIS — H3581 Retinal edema: Secondary | ICD-10-CM | POA: Diagnosis not present

## 2018-09-12 DIAGNOSIS — H33313 Horseshoe tear of retina without detachment, bilateral: Secondary | ICD-10-CM | POA: Diagnosis not present

## 2018-09-13 DIAGNOSIS — H3581 Retinal edema: Secondary | ICD-10-CM | POA: Diagnosis not present

## 2018-09-13 DIAGNOSIS — H33313 Horseshoe tear of retina without detachment, bilateral: Secondary | ICD-10-CM | POA: Diagnosis not present

## 2018-09-13 DIAGNOSIS — H35371 Puckering of macula, right eye: Secondary | ICD-10-CM | POA: Diagnosis not present

## 2018-09-15 DIAGNOSIS — H35371 Puckering of macula, right eye: Secondary | ICD-10-CM | POA: Diagnosis not present

## 2018-09-15 DIAGNOSIS — H3581 Retinal edema: Secondary | ICD-10-CM | POA: Diagnosis not present

## 2018-09-20 DIAGNOSIS — R102 Pelvic and perineal pain: Secondary | ICD-10-CM | POA: Diagnosis not present

## 2018-09-20 DIAGNOSIS — H3581 Retinal edema: Secondary | ICD-10-CM | POA: Diagnosis not present

## 2018-09-20 DIAGNOSIS — M62838 Other muscle spasm: Secondary | ICD-10-CM | POA: Diagnosis not present

## 2018-09-20 DIAGNOSIS — N393 Stress incontinence (female) (male): Secondary | ICD-10-CM | POA: Diagnosis not present

## 2018-09-20 DIAGNOSIS — M6281 Muscle weakness (generalized): Secondary | ICD-10-CM | POA: Diagnosis not present

## 2018-09-27 DIAGNOSIS — H3581 Retinal edema: Secondary | ICD-10-CM | POA: Diagnosis not present
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
## 2018-09-28 ENCOUNTER — Ambulatory Visit (INDEPENDENT_AMBULATORY_CARE_PROVIDER_SITE_OTHER): Payer: Medicare Other | Admitting: Nurse Practitioner

## 2018-09-28 ENCOUNTER — Encounter: Payer: Self-pay | Admitting: Nurse Practitioner

## 2018-09-28 VITALS — BP 132/82 | HR 88 | Ht 71.0 in | Wt 160.4 lb

## 2018-09-28 DIAGNOSIS — H539 Unspecified visual disturbance: Secondary | ICD-10-CM | POA: Diagnosis not present

## 2018-09-28 DIAGNOSIS — M6281 Muscle weakness (generalized): Secondary | ICD-10-CM | POA: Insufficient documentation

## 2018-09-28 DIAGNOSIS — R252 Cramp and spasm: Secondary | ICD-10-CM | POA: Diagnosis not present

## 2018-09-28 DIAGNOSIS — M62838 Other muscle spasm: Secondary | ICD-10-CM | POA: Diagnosis not present

## 2018-09-28 DIAGNOSIS — T17308A Unspecified foreign body in larynx causing other injury, initial encounter: Secondary | ICD-10-CM | POA: Insufficient documentation

## 2018-09-28 DIAGNOSIS — R102 Pelvic and perineal pain: Secondary | ICD-10-CM | POA: Diagnosis not present

## 2018-09-28 DIAGNOSIS — M549 Dorsalgia, unspecified: Secondary | ICD-10-CM | POA: Diagnosis not present

## 2018-09-28 DIAGNOSIS — T17308D Unspecified foreign body in larynx causing other injury, subsequent encounter: Secondary | ICD-10-CM

## 2018-09-28 DIAGNOSIS — N393 Stress incontinence (female) (male): Secondary | ICD-10-CM | POA: Diagnosis not present

## 2018-09-28 NOTE — Patient Instructions (Addendum)
Maintain strict control of hypertension with blood pressure goal below 130/90, Control of diabetes with hemoglobin A1c below 6.5 followed by primary care  Cholesterol with LDL cholesterol less than 70, followed by primary care,  Continue physical therapy Will get 4 limb EMG need double slot  Swallowing study to be ordered by Dr. Jaynee Eagles Follow up with Dr. Jaynee Eagles for EMG

## 2018-10-01 ENCOUNTER — Other Ambulatory Visit: Payer: Self-pay | Admitting: Neurology

## 2018-10-01 DIAGNOSIS — R1310 Dysphagia, unspecified: Secondary | ICD-10-CM

## 2018-10-01 DIAGNOSIS — K148 Other diseases of tongue: Secondary | ICD-10-CM

## 2018-10-01 NOTE — Progress Notes (Signed)
Made any corrections needed, and agree with history, physical, neuro exam,assessment and plan as stated.     Macaila Tahir, MD [facility]  

## 2018-10-04 DIAGNOSIS — M62838 Other muscle spasm: Secondary | ICD-10-CM | POA: Diagnosis not present

## 2018-10-04 DIAGNOSIS — M6281 Muscle weakness (generalized): Secondary | ICD-10-CM | POA: Diagnosis not present

## 2018-10-04 DIAGNOSIS — M549 Dorsalgia, unspecified: Secondary | ICD-10-CM | POA: Diagnosis not present

## 2018-10-04 DIAGNOSIS — R102 Pelvic and perineal pain: Secondary | ICD-10-CM | POA: Diagnosis not present

## 2018-10-04 DIAGNOSIS — N393 Stress incontinence (female) (male): Secondary | ICD-10-CM | POA: Diagnosis not present

## 2018-10-05 DIAGNOSIS — M6281 Muscle weakness (generalized): Secondary | ICD-10-CM | POA: Diagnosis not present

## 2018-10-05 DIAGNOSIS — R102 Pelvic and perineal pain: Secondary | ICD-10-CM | POA: Diagnosis not present

## 2018-10-05 DIAGNOSIS — N393 Stress incontinence (female) (male): Secondary | ICD-10-CM | POA: Diagnosis not present

## 2018-10-05 DIAGNOSIS — M62838 Other muscle spasm: Secondary | ICD-10-CM | POA: Diagnosis not present

## 2018-10-11 DIAGNOSIS — H35371 Puckering of macula, right eye: Secondary | ICD-10-CM | POA: Diagnosis not present

## 2018-10-11 DIAGNOSIS — M6281 Muscle weakness (generalized): Secondary | ICD-10-CM | POA: Diagnosis not present

## 2018-10-11 DIAGNOSIS — H3581 Retinal edema: Secondary | ICD-10-CM | POA: Diagnosis not present

## 2018-10-11 DIAGNOSIS — N393 Stress incontinence (female) (male): Secondary | ICD-10-CM | POA: Diagnosis not present

## 2018-10-11 DIAGNOSIS — R102 Pelvic and perineal pain: Secondary | ICD-10-CM | POA: Diagnosis not present

## 2018-10-11 DIAGNOSIS — M62838 Other muscle spasm: Secondary | ICD-10-CM | POA: Diagnosis not present

## 2018-10-12 DIAGNOSIS — Z87891 Personal history of nicotine dependence: Secondary | ICD-10-CM | POA: Diagnosis not present

## 2018-10-12 DIAGNOSIS — E7800 Pure hypercholesterolemia, unspecified: Secondary | ICD-10-CM | POA: Diagnosis not present

## 2018-10-12 DIAGNOSIS — Z299 Encounter for prophylactic measures, unspecified: Secondary | ICD-10-CM | POA: Diagnosis not present

## 2018-10-12 DIAGNOSIS — G459 Transient cerebral ischemic attack, unspecified: Secondary | ICD-10-CM | POA: Diagnosis not present

## 2018-10-12 DIAGNOSIS — Z6823 Body mass index (BMI) 23.0-23.9, adult: Secondary | ICD-10-CM | POA: Diagnosis not present

## 2018-10-12 DIAGNOSIS — H5440 Blindness, one eye, unspecified eye: Secondary | ICD-10-CM | POA: Diagnosis not present

## 2018-10-18 ENCOUNTER — Ambulatory Visit (HOSPITAL_COMMUNITY)
Admission: RE | Admit: 2018-10-18 | Discharge: 2018-10-18 | Disposition: A | Discharge status: Home / Self Care | Payer: Medicare Other | Source: Ambulatory Visit | Attending: Neurology | Admitting: Neurology

## 2018-10-18 DIAGNOSIS — R1310 Dysphagia, unspecified: Secondary | ICD-10-CM

## 2018-10-18 DIAGNOSIS — K148 Other diseases of tongue: Secondary | ICD-10-CM

## 2018-10-18 NOTE — Progress Notes (Signed)
Modified Barium Swallow Progress Note  Patient Details  Name: Blake Moss MRN: [id number] Date of Birth: Oct 25, 1950  Today's Date: 10/18/2018  Modified Barium Swallow completed.  Full report located under Chart Review in the Imaging Section.  Brief recommendations include the following:  Clinical Impression  Although pt exhibits mild weakness of the face/lips and tongue, this does not interfere with swallow function. Oropharyngeal swallow is timely, efficient, and with good airway protection even across challenging. Of note, pt does suggest that he tends to have increased difficulty in the evenings. SLP reviewed signs of dysphagia/aspiration for which to monitor. Would continue with regular diet and thin liquids as tolerated using general aspiration precautions.   Swallow Evaluation Recommendations   Recommended Consults: Other (Comment)(continue to follow with neurology)   SLP Diet Recommendations: Regular solids;Thin liquid   Liquid Administration via: Cup;Straw   Medication Administration: Whole meds with liquid   Supervision: Patient able to self feed   Compensations: Slow rate;Small sips/bites   Postural Changes: Seated upright at 90 degrees   Oral Care Recommendations: Oral care BID        Venita Sheffield Eren Ryser 10/18/2018,12:16 PM   Nuala Alpha, M.A. [place] [facility] [employer] education officer [phone] Office [phone]

## 2018-10-26 DIAGNOSIS — M6281 Muscle weakness (generalized): Secondary | ICD-10-CM | POA: Diagnosis not present

## 2018-10-26 DIAGNOSIS — N393 Stress incontinence (female) (male): Secondary | ICD-10-CM | POA: Diagnosis not present

## 2018-10-26 DIAGNOSIS — M62838 Other muscle spasm: Secondary | ICD-10-CM | POA: Diagnosis not present

## 2018-10-26 DIAGNOSIS — M549 Dorsalgia, unspecified: Secondary | ICD-10-CM | POA: Diagnosis not present

## 2018-10-26 DIAGNOSIS — R102 Pelvic and perineal pain: Secondary | ICD-10-CM | POA: Diagnosis not present

## 2018-11-06 DIAGNOSIS — G459 Transient cerebral ischemic attack, unspecified: Secondary | ICD-10-CM | POA: Diagnosis not present

## 2018-11-09 DIAGNOSIS — N393 Stress incontinence (female) (male): Secondary | ICD-10-CM | POA: Diagnosis not present

## 2018-11-09 DIAGNOSIS — M549 Dorsalgia, unspecified: Secondary | ICD-10-CM | POA: Diagnosis not present

## 2018-11-09 DIAGNOSIS — R102 Pelvic and perineal pain: Secondary | ICD-10-CM | POA: Diagnosis not present

## 2018-11-09 DIAGNOSIS — M6281 Muscle weakness (generalized): Secondary | ICD-10-CM | POA: Diagnosis not present

## 2018-11-09 DIAGNOSIS — M62838 Other muscle spasm: Secondary | ICD-10-CM | POA: Diagnosis not present

## 2018-11-13 ENCOUNTER — Telehealth: Payer: Self-pay

## 2018-11-13 NOTE — Telephone Encounter (Signed)
I also do not see where another provider has sooner availability to do a double study EMG before 12/28/2018.

## 2018-11-13 NOTE — Telephone Encounter (Signed)
Those two spots this Thursday are no longer available unfortunately.

## 2018-11-13 NOTE — Telephone Encounter (Signed)
This patient was originally scheduled for 3/19, but I will be out of the office that day and he was moved to 4/23. Dr. Jaynee Eagles, it looks like you had 2 cancellations this coming Thursday, would it be alright to add him there? (he is the pt that needed 2 spots) Or do you think another provider would be willing to do his test sooner?

## 2018-11-14 NOTE — Telephone Encounter (Signed)
I placed him on Dr. Cathren Laine schedule 11/16/2018 for EMG and canceled the 4/23 EMG/NCS.

## 2018-11-14 NOTE — Telephone Encounter (Signed)
Thank you! I have added patient to my schedule at 10:30 on 3/12.

## 2018-11-14 NOTE — Telephone Encounter (Signed)
Great - thanks

## 2018-11-14 NOTE — Telephone Encounter (Signed)
I called and offered the 10:30 appointmentt and he accepted. Let me know when the spot is added and I will move him!

## 2018-11-14 NOTE — Telephone Encounter (Signed)
Discussed with Dr. Jaynee Eagles. Katie or Lovena Le, would you mind calling this patient and asking if he would like to move up his NCV/EMG to this Thursday (3/12), arrive at 10:30a? If so, let me know and I will add a spot to the schedule. Thanks!

## 2018-11-16 ENCOUNTER — Ambulatory Visit (INDEPENDENT_AMBULATORY_CARE_PROVIDER_SITE_OTHER): Payer: Medicare Other | Admitting: Neurology

## 2018-11-16 ENCOUNTER — Other Ambulatory Visit: Payer: Self-pay

## 2018-11-16 DIAGNOSIS — T17308D Unspecified foreign body in larynx causing other injury, subsequent encounter: Secondary | ICD-10-CM

## 2018-11-16 DIAGNOSIS — R253 Fasciculation: Secondary | ICD-10-CM

## 2018-11-16 DIAGNOSIS — M6281 Muscle weakness (generalized): Secondary | ICD-10-CM | POA: Diagnosis not present

## 2018-11-16 DIAGNOSIS — M791 Myalgia, unspecified site: Secondary | ICD-10-CM | POA: Diagnosis not present

## 2018-11-16 DIAGNOSIS — R252 Cramp and spasm: Secondary | ICD-10-CM | POA: Diagnosis not present

## 2018-11-16 DIAGNOSIS — Z0289 Encounter for other administrative examinations: Secondary | ICD-10-CM

## 2018-11-16 DIAGNOSIS — R531 Weakness: Secondary | ICD-10-CM

## 2018-11-16 NOTE — Progress Notes (Signed)
See procedure note.

## 2018-11-22 NOTE — Progress Notes (Signed)
This is a 68 year old gentleman who is here for evaluation of fasciculations, muscle cramps, weakness, and muscle pain.  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  Patient reports that approximately 6 months ago he started becoming weaker, muscle pain, significant muscle cramping, not participating in activities as he did in the past.  He has been to physical therapy and he feels his strength is improving.  He just does not understand why he has so much muscle pain.  Also cramping and spasms especially when seated.  He also says he is losing muscle mass, however on physical examination today I do not see any atrophy or asymmetry in his muscles.  He also complains of choking spells and he underwent a swallow evaluation that was unremarkable.  He reports he gets "hoarse" but otherwise does not report any facial weakness.  He was referred for EMG nerve conduction study.  Focused exam did show brisk reflexes, unclear but a possible fasciculation in the left upper arm, but if present very minimal.  His toes are equivocal bilaterally.  Tone is normal.  No atrophy seen in exam.  Possibly some mild weakness with mouth closure and puffing out cheeks.  No atrophy of the tongue or fasciculations.  EMG nerve conduction studies today were absolutely normal, discussed there is no neurophysiologic evidence for motor neuron disease, mononeuropathy, polyneuropathy, myopathy or myositis or radiculopathy.  His strength is actually very good 5 out of 5 and he feels improved since going to physical therapy.  Given his brisk reflexes we did discuss MRI of the cervical spine however patient declines he does not report any ataxia or myelopathic symptoms and has no neck pain and no radicular symptoms.  We discussed an MRI of the lumbar spine but again he denies  any back pain, radicular symptoms.  Given that he is improved with physical therapy, stretching and his strength is 5 out of 5.  Reviewed labs, patient is prediabetic and he was asked to follow-up with his primary care.  LDL 136 also advised to follow-up with primary care.  CBC and CMP in the past were normal.  He denies any sensory changes.  I do not think at this time there is evidence for a neurodegenerative disorder but will continue to follow patient for any progression.   A total of 25  minutes was spent face-to-face with this patient. Over half this time was spent on counseling patient on the  1. Weakness   2. Fasciculation   3. Muscle cramp   4. Muscle pain    diagnosis and different diagnostic and therapeutic options, counseling and coordination of care, risks ans benefits of management, compliance, or risk factor reduction and education.  This does not include any time spent on EMG nerve conduction study procedure.

## 2018-11-22 NOTE — Progress Notes (Signed)
See procedure note.

## 2018-11-22 NOTE — Progress Notes (Signed)
Full Name: Blake Moss Gender: Male MRN #: [id number] Date of Birth: 30-Jun-1951    Visit Date: 11/16/2018 10:33 Age: 68 Years 31 Months Old Examining Physician: Sarina Ill, MD  Referring Physician: Cecille Rubin, NP    History: This is a 68 year old gentleman who is here for evaluation of fasciculations, muscle cramps, weakness, and muscle pain.  Summary: EMG/NCS was performed on the left upper and left lower extremities.  All nerves and muscles (as detailed in the following tables) were within normal limits.  Conclusion: Normal examination. No neurophysiologic evidence for motor neuron disease, mononeuropathy, polyneuropathy, myopathy or myositis or radiculopathy.   Sarina Ill, M.D.  [facility] [place], [place] [address] Tel: [phone] Fax: [phone]        [facility]    Nerve / Sites Muscle Latency Ref. Amplitude Ref. Rel Amp Segments Distance Velocity Ref. Area    ms ms mV mV %  cm m/s m/s mVms  L Median - APB     Wrist APB 3.5 ?4.4 9.3 ?4.0 100 Wrist - APB 7   30.5     Upper arm APB 7.8  9.0  96.5 Upper arm - Wrist 23 55 ?49 30.7  L Ulnar - ADM     Wrist ADM 2.6 ?3.3 12.3 ?6.0 100 Wrist - ADM 7   40.5     B.Elbow ADM 6.7  11.6  93.7 B.Elbow - Wrist 22 53 ?49 40.2     A.Elbow ADM 8.7  11.7  101 A.Elbow - B.Elbow 10 49 ?49 38.7         A.Elbow - Wrist      L Peroneal - EDB     Ankle EDB 5.2 ?6.5 4.7 ?2.0 100 Ankle - EDB 9   16.8     Fib head EDB 11.8  4.5  94.3 Fib head - Ankle 31 47 ?44 16.6     Pop fossa EDB 14.0  4.3  97.2 Pop fossa - Fib head 10 47 ?44 16.3         Pop fossa - Ankle      L Tibial - AH     Ankle AH 3.9 ?5.8 8.1 ?4.0 100 Ankle - AH 9   18.4     Pop fossa AH 13.2  6.4  79.3 Pop fossa - Ankle 38 41 ?41 16.8  L Ulnar - FDI     Wrist FDI 4.0 ?4.5 8.2 ?7.0 100 Wrist - FDI 8   19.8     B.Elbow FDI 8.1  7.7  93.8 B.Elbow - Wrist 22 53 ?49 19.4     A.Elbow FDI 10.1  7.6  98 A.Elbow - B.Elbow 10 52 ?49 19.4      A.Elbow - Wrist                   SNC    Nerve / Sites Rec. Site Peak Lat Ref.  Amp Ref. Segments Distance    ms ms V V  cm  L Radial - Anatomical snuff box (Forearm)     Forearm Wrist 2.6 ?2.9 33 ?15 Forearm - Wrist 10  L Sural - Ankle (Calf)     Calf Ankle 3.7 ?4.4 9 ?6 Calf - Ankle 14  L Superficial peroneal - Ankle     Lat leg Ankle 4.2 ?4.4 7 ?6 Lat leg - Ankle 14  L Median - Orthodromic (Dig II, Mid palm)     Dig II Wrist  3.3 ?3.4 15 ?10 Dig II - Wrist 13  L Ulnar - Orthodromic, (Dig V, Mid palm)     Dig V Wrist 3.1 ?3.1 8 ?5 Dig V - Wrist 71               F  Wave    Nerve F Lat Ref.   ms ms  L Median - APB 29.4 ?31.0  L Ulnar - ADM 29.8 ?32.0  L Peroneal - EDB 53.7 ?56.0  L Tibial - AH 56.0 ?56.0             H Reflex    Nerve H Lat Lat Hmax   ms ms   Left Right Ref. Left Right Ref.  Tibial - Soleus 32.8 31.8 ?35.0 32.8 31.8 ?35.0  Tibial - Soleus 32.7  ?35.0 32.7  ?35.0                 EMG full       EMG Summary Table    Spontaneous MUAP Recruitment  Muscle IA Fib PSW Fasc Other Amp Dur. Poly Pattern  L. Triceps brachii Normal None None None _______ Normal Normal Normal Normal  L. Deltoid Normal None None None _______ Normal Normal Normal Normal  L. Flexor pollicis longus Normal None None None _______ Normal Normal Normal Normal  L. Extensor indicis proprius Normal None None None _______ Normal Normal Normal Normal  L. First dorsal interosseous Normal None None None _______ Normal Normal Normal Normal  L. Opponens pollicis Normal None None None _______ Normal Normal Normal Normal  L. Vastus medialis Normal None None None _______ Normal Normal Normal Normal  L. Tibialis anterior Normal None None None _______ Normal Normal Normal Normal  L. Gastrocnemius (Medial head) Normal None None None _______ Normal Normal Normal Normal  L. Iliopsoas Normal None None None _______ Normal Normal Normal Normal  L. Abductor hallucis Normal None None None _______ Normal  Normal Normal Normal  L. Thoracic paraspinals Normal None None None _______ Normal Normal Normal Normal  L. Genioglossus Normal None None None _______ Normal Normal Normal Normal  L. Cervical paraspinals Normal None None None _______ Normal Normal Normal Normal

## 2018-11-22 NOTE — Procedures (Signed)
Full Name: Blake Moss Gender: Male MRN #: [id number] Date of Birth: 1951/02/27    Visit Date: 11/16/2018 10:33 Age: 68 Years 75 Months Old Examining Physician: Sarina Ill, MD  Referring Physician: Cecille Rubin, NP    History: This is a 68 year old gentleman who is here for evaluation of fasciculations, muscle cramps, weakness, and muscle pain.  Summary: EMG/NCS was performed on the left upper and left lower extremities.  All nerves and muscles (as detailed in the following tables) were within normal limits.  Conclusion: Normal examination. No neurophysiologic evidence for motor neuron disease, mononeuropathy, polyneuropathy, myopathy or myositis or radiculopathy.   Sarina Ill, M.D.  [facility] [place], [place] [address] Tel: [phone] Fax: [phone]        [facility]    Nerve / Sites Muscle Latency Ref. Amplitude Ref. Rel Amp Segments Distance Velocity Ref. Area    ms ms mV mV %  cm m/s m/s mVms  L Median - APB     Wrist APB 3.5 ?4.4 9.3 ?4.0 100 Wrist - APB 7   30.5     Upper arm APB 7.8  9.0  96.5 Upper arm - Wrist 23 55 ?49 30.7  L Ulnar - ADM     Wrist ADM 2.6 ?3.3 12.3 ?6.0 100 Wrist - ADM 7   40.5     B.Elbow ADM 6.7  11.6  93.7 B.Elbow - Wrist 22 53 ?49 40.2     A.Elbow ADM 8.7  11.7  101 A.Elbow - B.Elbow 10 49 ?49 38.7         A.Elbow - Wrist      L Peroneal - EDB     Ankle EDB 5.2 ?6.5 4.7 ?2.0 100 Ankle - EDB 9   16.8     Fib head EDB 11.8  4.5  94.3 Fib head - Ankle 31 47 ?44 16.6     Pop fossa EDB 14.0  4.3  97.2 Pop fossa - Fib head 10 47 ?44 16.3         Pop fossa - Ankle      L Tibial - AH     Ankle AH 3.9 ?5.8 8.1 ?4.0 100 Ankle - AH 9   18.4     Pop fossa AH 13.2  6.4  79.3 Pop fossa - Ankle 38 41 ?41 16.8  L Ulnar - FDI     Wrist FDI 4.0 ?4.5 8.2 ?7.0 100 Wrist - FDI 8   19.8     B.Elbow FDI 8.1  7.7  93.8 B.Elbow - Wrist 22 53 ?49 19.4     A.Elbow FDI 10.1  7.6  98 A.Elbow - B.Elbow 10 52 ?49 19.4      A.Elbow - Wrist                   SNC    Nerve / Sites Rec. Site Peak Lat Ref.  Amp Ref. Segments Distance    ms ms V V  cm  L Radial - Anatomical snuff box (Forearm)     Forearm Wrist 2.6 ?2.9 33 ?15 Forearm - Wrist 10  L Sural - Ankle (Calf)     Calf Ankle 3.7 ?4.4 9 ?6 Calf - Ankle 14  L Superficial peroneal - Ankle     Lat leg Ankle 4.2 ?4.4 7 ?6 Lat leg - Ankle 14  L Median - Orthodromic (Dig II, Mid palm)     Dig II Wrist  3.3 ?3.4 15 ?10 Dig II - Wrist 13  L Ulnar - Orthodromic, (Dig V, Mid palm)     Dig V Wrist 3.1 ?3.1 8 ?5 Dig V - Wrist 76               F  Wave    Nerve F Lat Ref.   ms ms  L Median - APB 29.4 ?31.0  L Ulnar - ADM 29.8 ?32.0  L Peroneal - EDB 53.7 ?56.0  L Tibial - AH 56.0 ?56.0             H Reflex    Nerve H Lat Lat Hmax   ms ms   Left Right Ref. Left Right Ref.  Tibial - Soleus 32.8 31.8 ?35.0 32.8 31.8 ?35.0  Tibial - Soleus 32.7  ?35.0 32.7  ?35.0                 EMG full       EMG Summary Table    Spontaneous MUAP Recruitment  Muscle IA Fib PSW Fasc Other Amp Dur. Poly Pattern  L. Triceps brachii Normal None None None _______ Normal Normal Normal Normal  L. Deltoid Normal None None None _______ Normal Normal Normal Normal  L. Flexor pollicis longus Normal None None None _______ Normal Normal Normal Normal  L. Extensor indicis proprius Normal None None None _______ Normal Normal Normal Normal  L. First dorsal interosseous Normal None None None _______ Normal Normal Normal Normal  L. Opponens pollicis Normal None None None _______ Normal Normal Normal Normal  L. Vastus medialis Normal None None None _______ Normal Normal Normal Normal  L. Tibialis anterior Normal None None None _______ Normal Normal Normal Normal  L. Gastrocnemius (Medial head) Normal None None None _______ Normal Normal Normal Normal  L. Iliopsoas Normal None None None _______ Normal Normal Normal Normal  L. Abductor hallucis Normal None None None _______ Normal  Normal Normal Normal  L. Thoracic paraspinals Normal None None None _______ Normal Normal Normal Normal  L. Genioglossus Normal None None None _______ Normal Normal Normal Normal  L. Cervical paraspinals Normal None None None _______ Normal Normal Normal Normal

## 2018-11-23 ENCOUNTER — Encounter: Payer: Medicare Other | Admitting: Neurology

## 2018-12-28 ENCOUNTER — Encounter: Payer: Medicare Other | Admitting: Neurology

## 2019-01-12 DIAGNOSIS — C61 Malignant neoplasm of prostate: Secondary | ICD-10-CM | POA: Diagnosis not present

## 2019-01-15 DIAGNOSIS — M62838 Other muscle spasm: Secondary | ICD-10-CM | POA: Diagnosis not present

## 2019-01-15 DIAGNOSIS — M549 Dorsalgia, unspecified: Secondary | ICD-10-CM | POA: Diagnosis not present

## 2019-01-15 DIAGNOSIS — N393 Stress incontinence (female) (male): Secondary | ICD-10-CM | POA: Diagnosis not present

## 2019-01-15 DIAGNOSIS — M6281 Muscle weakness (generalized): Secondary | ICD-10-CM | POA: Diagnosis not present

## 2019-01-15 DIAGNOSIS — R102 Pelvic and perineal pain: Secondary | ICD-10-CM | POA: Diagnosis not present

## 2019-01-19 DIAGNOSIS — C61 Malignant neoplasm of prostate: Secondary | ICD-10-CM | POA: Diagnosis not present

## 2019-01-19 DIAGNOSIS — N393 Stress incontinence (female) (male): Secondary | ICD-10-CM | POA: Diagnosis not present

## 2019-01-22 DIAGNOSIS — N393 Stress incontinence (female) (male): Secondary | ICD-10-CM | POA: Diagnosis not present

## 2019-01-22 DIAGNOSIS — R102 Pelvic and perineal pain: Secondary | ICD-10-CM | POA: Diagnosis not present

## 2019-01-22 DIAGNOSIS — M6281 Muscle weakness (generalized): Secondary | ICD-10-CM | POA: Diagnosis not present

## 2019-01-22 DIAGNOSIS — M62838 Other muscle spasm: Secondary | ICD-10-CM | POA: Diagnosis not present

## 2019-02-05 DIAGNOSIS — M549 Dorsalgia, unspecified: Secondary | ICD-10-CM | POA: Diagnosis not present

## 2019-02-05 DIAGNOSIS — M62838 Other muscle spasm: Secondary | ICD-10-CM | POA: Diagnosis not present

## 2019-02-05 DIAGNOSIS — R102 Pelvic and perineal pain: Secondary | ICD-10-CM | POA: Diagnosis not present

## 2019-02-05 DIAGNOSIS — N393 Stress incontinence (female) (male): Secondary | ICD-10-CM | POA: Diagnosis not present

## 2019-02-05 DIAGNOSIS — M6281 Muscle weakness (generalized): Secondary | ICD-10-CM | POA: Diagnosis not present

## 2019-02-26 DIAGNOSIS — M62838 Other muscle spasm: Secondary | ICD-10-CM | POA: Diagnosis not present

## 2019-02-26 DIAGNOSIS — M6281 Muscle weakness (generalized): Secondary | ICD-10-CM | POA: Diagnosis not present

## 2019-02-26 DIAGNOSIS — N393 Stress incontinence (female) (male): Secondary | ICD-10-CM | POA: Diagnosis not present

## 2019-02-26 DIAGNOSIS — R102 Pelvic and perineal pain: Secondary | ICD-10-CM | POA: Diagnosis not present

## 2019-03-27 DIAGNOSIS — M62838 Other muscle spasm: Secondary | ICD-10-CM | POA: Diagnosis not present

## 2019-03-27 DIAGNOSIS — M6281 Muscle weakness (generalized): Secondary | ICD-10-CM | POA: Diagnosis not present

## 2019-03-27 DIAGNOSIS — R102 Pelvic and perineal pain: Secondary | ICD-10-CM | POA: Diagnosis not present

## 2019-03-27 DIAGNOSIS — N393 Stress incontinence (female) (male): Secondary | ICD-10-CM | POA: Diagnosis not present

## 2019-03-27 DIAGNOSIS — M549 Dorsalgia, unspecified: Secondary | ICD-10-CM | POA: Diagnosis not present

## 2019-03-30 DIAGNOSIS — H35372 Puckering of macula, left eye: Secondary | ICD-10-CM | POA: Diagnosis not present

## 2019-03-30 DIAGNOSIS — H53122 Transient visual loss, left eye: Secondary | ICD-10-CM | POA: Diagnosis not present

## 2019-04-05 DIAGNOSIS — H4313 Vitreous hemorrhage, bilateral: Secondary | ICD-10-CM | POA: Diagnosis not present

## 2019-04-17 DIAGNOSIS — N393 Stress incontinence (female) (male): Secondary | ICD-10-CM | POA: Diagnosis not present

## 2019-04-17 DIAGNOSIS — M62838 Other muscle spasm: Secondary | ICD-10-CM | POA: Diagnosis not present

## 2019-04-17 DIAGNOSIS — M6281 Muscle weakness (generalized): Secondary | ICD-10-CM | POA: Diagnosis not present

## 2019-04-17 DIAGNOSIS — R102 Pelvic and perineal pain: Secondary | ICD-10-CM | POA: Diagnosis not present

## 2019-04-26 ENCOUNTER — Telehealth: Payer: Self-pay | Admitting: Family Medicine

## 2019-04-26 NOTE — Telephone Encounter (Signed)
I called patient regarding rescheduling 9/14 appt due to NP out of office. I requested pt call back to reschedule.

## 2019-05-07 DIAGNOSIS — M6281 Muscle weakness (generalized): Secondary | ICD-10-CM | POA: Diagnosis not present

## 2019-05-07 DIAGNOSIS — N393 Stress incontinence (female) (male): Secondary | ICD-10-CM | POA: Diagnosis not present

## 2019-05-07 DIAGNOSIS — M62838 Other muscle spasm: Secondary | ICD-10-CM | POA: Diagnosis not present

## 2019-05-07 DIAGNOSIS — M549 Dorsalgia, unspecified: Secondary | ICD-10-CM | POA: Diagnosis not present

## 2019-05-07 DIAGNOSIS — R102 Pelvic and perineal pain: Secondary | ICD-10-CM | POA: Diagnosis not present

## 2019-05-21 ENCOUNTER — Ambulatory Visit: Payer: Medicare Other | Admitting: Family Medicine
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
## 2019-05-23 ENCOUNTER — Other Ambulatory Visit: Payer: Self-pay

## 2019-05-23 ENCOUNTER — Ambulatory Visit (INDEPENDENT_AMBULATORY_CARE_PROVIDER_SITE_OTHER): Payer: Medicare Other | Admitting: Family Medicine

## 2019-05-23 ENCOUNTER — Encounter: Payer: Self-pay | Admitting: Family Medicine

## 2019-05-23 VITALS — BP 118/78 | HR 77 | Temp 98.2°F | Ht 71.0 in | Wt 162.2 lb

## 2019-05-23 DIAGNOSIS — C61 Malignant neoplasm of prostate: Secondary | ICD-10-CM

## 2019-05-23 DIAGNOSIS — M6281 Muscle weakness (generalized): Secondary | ICD-10-CM | POA: Diagnosis not present

## 2019-05-23 DIAGNOSIS — H539 Unspecified visual disturbance: Secondary | ICD-10-CM

## 2019-05-23 NOTE — Patient Instructions (Addendum)
Continue close follow up with PCP  Consider referral to [facility] or [facility] if symptoms continue  Follow up as needed   Stroke Prevention Some medical conditions and lifestyle choices can lead to a higher risk for a stroke. You can help to prevent a stroke by making nutrition, lifestyle, and other changes. What nutrition changes can be made?   Eat healthy foods. ? Choose foods that are high in fiber. These include:  Fresh fruits.  Fresh vegetables.  Whole grains. ? Eat at least 5 or more servings of fruits and vegetables each day. Try to fill half of your plate at each meal with fruits and vegetables. ? Choose lean protein foods. These include:  Lowfat (lean) cuts of meat.  Chicken without skin.  Fish.  Tofu.  Beans.  Nuts. ? Eat low-fat dairy products. ? Avoid foods that:  Are high in salt (sodium).  Have saturated fat.  Have trans fat.  Have cholesterol.  Are processed.  Are premade.  Follow eating guidelines as told by your doctor. These may include: ? Reducing how many calories you eat and drink each day. ? Limiting how much salt you eat or drink each day to 1,500 milligrams (mg). ? Using only healthy fats for cooking. These include:  Olive oil.  Canola oil.  Sunflower oil. ? Counting how many carbohydrates you eat and drink each day. What lifestyle changes can be made?  Try to stay at a healthy weight. Talk to your doctor about what a good weight is for you.  Get at least 30 minutes of moderate physical activity at least 5 days a week. This can include: ? Fast walking. ? Biking. ? Swimming.  Do not use any products that have nicotine or tobacco. This includes cigarettes and e-cigarettes. If you need help quitting, ask your doctor. Avoid being around tobacco smoke in general.  Limit how much alcohol you drink to no more than 1 drink a day for nonpregnant women and 2 drinks a day for men. One drink equals 12 oz of beer, 5 oz of wine, or 1 oz of hard  liquor.  Do not use drugs.  Avoid taking birth control pills. Talk to your doctor about the risks of taking birth control pills if: ? You are over 33 years old. ? You smoke. ? You get migraines. ? You have had a blood clot. What other changes can be made?  Manage your cholesterol. ? It is important to eat a healthy diet. ? If your cholesterol cannot be managed through your diet, you may also need to take medicines. Take medicines as told by your doctor.  Manage your diabetes. ? It is important to eat a healthy diet and to exercise regularly. ? If your blood sugar cannot be managed through diet and exercise, you may need to take medicines. Take medicines as told by your doctor.  Control your high blood pressure (hypertension). ? Try to keep your blood pressure below 130/80. This can help lower your risk of stroke. ? It is important to eat a healthy diet and to exercise regularly. ? If your blood pressure cannot be managed through diet and exercise, you may need to take medicines. Take medicines as told by your doctor. ? Ask your doctor if you should check your blood pressure at home. ? Have your blood pressure checked every year. Do this even if your blood pressure is normal.  Talk to your doctor about getting checked for a sleep disorder. Signs of this can  include: ? Snoring a lot. ? Feeling very tired.  Take over-the-counter and prescription medicines only as told by your doctor. These may include aspirin or blood thinners (antiplatelets or anticoagulants).  Make sure that any other medical conditions you have are managed. Where to find more information  American Stroke Association: www.strokeassociation.org  National Stroke Association: www.stroke.org Get help right away if:  You have any symptoms of stroke. "BE FAST" is an easy way to remember the main warning signs: ? B - Balance. Signs are dizziness, sudden trouble walking, or loss of balance. ? E - Eyes. Signs are  trouble seeing or a sudden change in how you see. ? F - Face. Signs are sudden weakness or loss of feeling of the face, or the face or eyelid drooping on one side. ? A - Arms. Signs are weakness or loss of feeling in an arm. This happens suddenly and usually on one side of the body. ? S - Speech. Signs are sudden trouble speaking, slurred speech, or trouble understanding what people say. ? T - Time. Time to call emergency services. Write down what time symptoms started.  You have other signs of stroke, such as: ? A sudden, very bad headache with no known cause. ? Feeling sick to your stomach (nausea). ? Throwing up (vomiting). ? Jerky movements you cannot control (seizure). These symptoms may represent a serious problem that is an emergency. Do not wait to see if the symptoms will go away. Get medical help right away. Call your local emergency services (911 in the [place] Do not drive yourself to the hospital. Summary  You can prevent a stroke by eating healthy, exercising, not smoking, drinking less alcohol, and treating other health problems, such as diabetes, high blood pressure, or high cholesterol.  Do not use any products that contain nicotine or tobacco, such as cigarettes and e-cigarettes.  Get help right away if you have any signs or symptoms of a stroke. This information is not intended to replace advice given to you by your health care provider. Make sure you discuss any questions you have with your health care provider. Document Released: 02/22/2012 Document Revised: 10/19/2018 Document Reviewed: 11/24/2016 Elsevier Patient Education  [place].   Cholesterol Content in Foods Cholesterol is a waxy, fat-like substance that helps to carry fat in the blood. The body needs cholesterol in small amounts, but too much cholesterol can cause damage to the arteries and heart. Most people should eat less than 200 milligrams (mg) of cholesterol a day. Foods with cholesterol   Cholesterol is found in animal-based foods, such as meat, seafood, and dairy. Generally, low-fat dairy and lean meats have less cholesterol than full-fat dairy and fatty meats. The milligrams of cholesterol per serving (mg per serving) of common cholesterol-containing foods are listed below. Meat and other proteins  Egg - one large whole egg has 186 mg.  Veal shank - 4 oz has 141 mg.  Lean ground [place] (93% lean) - 4 oz has 118 mg.  Fat-trimmed lamb loin - 4 oz has 106 mg.  Lean ground beef (90% lean) - 4 oz has 100 mg.  Lobster - 3.5 oz has 90 mg.  Pork loin chops - 4 oz has 86 mg.  Canned salmon - 3.5 oz has 83 mg.  Fat-trimmed beef top loin - 4 oz has 78 mg.  Frankfurter - 1 frank (3.5 oz) has 77 mg.  Crab - 3.5 oz has 71 mg.  Roasted chicken without skin, white meat -  4 oz has 66 mg.  Light bologna - 2 oz has 45 mg.  Deli-cut [place] - 2 oz has 31 mg.  Canned tuna - 3.5 oz has 31 mg.  Bacon - 1 oz has 29 mg.  Oysters and mussels (raw) - 3.5 oz has 25 mg.  Mackerel - 1 oz has 22 mg.  Trout - 1 oz has 20 mg.  Pork sausage - 1 link (1 oz) has 17 mg.  Salmon - 1 oz has 16 mg.  Tilapia - 1 oz has 14 mg. Dairy  Soft-serve ice cream -  cup (4 oz) has 103 mg.  Whole-milk yogurt - 1 cup (8 oz) has 29 mg.  Cheddar cheese - 1 oz has 28 mg.  American cheese - 1 oz has 28 mg.  Whole milk - 1 cup (8 oz) has 23 mg.  2% milk - 1 cup (8 oz) has 18 mg.  Cream cheese - 1 tablespoon (Tbsp) has 15 mg.  Cottage cheese -  cup (4 oz) has 14 mg.  Low-fat (1%) milk - 1 cup (8 oz) has 10 mg.  Sour cream - 1 Tbsp has 8.5 mg.  Low-fat yogurt - 1 cup (8 oz) has 8 mg.  Nonfat Greek yogurt - 1 cup (8 oz) has 7 mg.  Half-and-half cream - 1 Tbsp has 5 mg. Fats and oils  Cod liver oil - 1 tablespoon (Tbsp) has 82 mg.  Butter - 1 Tbsp has 15 mg.  Lard - 1 Tbsp has 14 mg.  Bacon grease - 1 Tbsp has 14 mg.  Mayonnaise - 1 Tbsp has 5-10 mg.  Margarine - 1 Tbsp has  3-10 mg. Exact amounts of cholesterol in these foods may vary depending on specific ingredients and brands. Foods without cholesterol Most plant-based foods do not have cholesterol unless you combine them with a food that has cholesterol. Foods without cholesterol include:  Grains and cereals.  Vegetables.  Fruits.  Vegetable oils, such as olive, canola, and sunflower oil.  Legumes, such as peas, beans, and lentils.  Nuts and seeds.  Egg whites. Summary  The body needs cholesterol in small amounts, but too much cholesterol can cause damage to the arteries and heart.  Most people should eat less than 200 milligrams (mg) of cholesterol a day. This information is not intended to replace advice given to you by your health care provider. Make sure you discuss any questions you have with your health care provider. Document Released: 04/19/2017 Document Revised: 08/05/2017 Document Reviewed: 04/19/2017 Elsevier Patient Education  2020 [employer].

## 2019-05-23 NOTE — Progress Notes (Signed)
Thanks Amy, I agree if he wants further evaluation then refer to an academic center. thanks

## 2019-05-28 DIAGNOSIS — M549 Dorsalgia, unspecified: Secondary | ICD-10-CM | POA: Diagnosis not present

## 2019-05-28 DIAGNOSIS — R102 Pelvic and perineal pain: Secondary | ICD-10-CM | POA: Diagnosis not present

## 2019-05-28 DIAGNOSIS — M6281 Muscle weakness (generalized): Secondary | ICD-10-CM | POA: Diagnosis not present

## 2019-05-28 DIAGNOSIS — N393 Stress incontinence (female) (male): Secondary | ICD-10-CM | POA: Diagnosis not present

## 2019-05-28 DIAGNOSIS — M62838 Other muscle spasm: Secondary | ICD-10-CM | POA: Diagnosis not present

## 2019-06-18 DIAGNOSIS — M62838 Other muscle spasm: Secondary | ICD-10-CM | POA: Diagnosis not present

## 2019-06-18 DIAGNOSIS — M6281 Muscle weakness (generalized): Secondary | ICD-10-CM | POA: Diagnosis not present

## 2019-06-18 DIAGNOSIS — M549 Dorsalgia, unspecified: Secondary | ICD-10-CM | POA: Diagnosis not present

## 2019-06-18 DIAGNOSIS — R102 Pelvic and perineal pain: Secondary | ICD-10-CM | POA: Diagnosis not present

## 2019-07-18 DIAGNOSIS — M62838 Other muscle spasm: Secondary | ICD-10-CM | POA: Diagnosis not present

## 2019-07-18 DIAGNOSIS — M6281 Muscle weakness (generalized): Secondary | ICD-10-CM | POA: Diagnosis not present

## 2019-07-18 DIAGNOSIS — R102 Pelvic and perineal pain: Secondary | ICD-10-CM | POA: Diagnosis not present

## 2019-07-18 DIAGNOSIS — C61 Malignant neoplasm of prostate: Secondary | ICD-10-CM | POA: Diagnosis not present

## 2019-07-25 DIAGNOSIS — R102 Pelvic and perineal pain: Secondary | ICD-10-CM | POA: Diagnosis not present

## 2019-07-25 DIAGNOSIS — C61 Malignant neoplasm of prostate: Secondary | ICD-10-CM | POA: Diagnosis not present

## 2019-07-25 DIAGNOSIS — N393 Stress incontinence (female) (male): Secondary | ICD-10-CM | POA: Diagnosis not present

## 2019-08-07 DIAGNOSIS — M549 Dorsalgia, unspecified: Secondary | ICD-10-CM | POA: Diagnosis not present

## 2019-08-07 DIAGNOSIS — N393 Stress incontinence (female) (male): Secondary | ICD-10-CM | POA: Diagnosis not present

## 2019-08-07 DIAGNOSIS — M62838 Other muscle spasm: Secondary | ICD-10-CM | POA: Diagnosis not present

## 2019-08-07 DIAGNOSIS — R102 Pelvic and perineal pain: Secondary | ICD-10-CM | POA: Diagnosis not present

## 2019-08-07 DIAGNOSIS — M6281 Muscle weakness (generalized): Secondary | ICD-10-CM | POA: Diagnosis not present

## 2019-09-10 DIAGNOSIS — H40021 Open angle with borderline findings, high risk, right eye: Secondary | ICD-10-CM | POA: Diagnosis not present

## 2019-09-10 DIAGNOSIS — H43813 Vitreous degeneration, bilateral: Secondary | ICD-10-CM | POA: Diagnosis not present

## 2019-09-10 DIAGNOSIS — H35371 Puckering of macula, right eye: Secondary | ICD-10-CM | POA: Diagnosis not present

## 2019-09-10 DIAGNOSIS — H3582 Retinal ischemia: Secondary | ICD-10-CM | POA: Diagnosis not present

## 2019-09-17 DIAGNOSIS — R102 Pelvic and perineal pain: Secondary | ICD-10-CM | POA: Diagnosis not present

## 2019-09-17 DIAGNOSIS — M6281 Muscle weakness (generalized): Secondary | ICD-10-CM | POA: Diagnosis not present

## 2019-09-17 DIAGNOSIS — M62838 Other muscle spasm: Secondary | ICD-10-CM | POA: Diagnosis not present

## 2019-09-17 DIAGNOSIS — N393 Stress incontinence (female) (male): Secondary | ICD-10-CM | POA: Diagnosis not present

## 2019-10-19 DIAGNOSIS — Z23 Encounter for immunization: Secondary | ICD-10-CM | POA: Diagnosis not present

## 2019-10-23 DIAGNOSIS — R102 Pelvic and perineal pain: Secondary | ICD-10-CM | POA: Diagnosis not present

## 2019-10-23 DIAGNOSIS — M6281 Muscle weakness (generalized): Secondary | ICD-10-CM | POA: Diagnosis not present

## 2019-10-23 DIAGNOSIS — M62838 Other muscle spasm: Secondary | ICD-10-CM | POA: Diagnosis not present

## 2019-10-23 DIAGNOSIS — N393 Stress incontinence (female) (male): Secondary | ICD-10-CM | POA: Diagnosis not present

## 2019-10-29 DIAGNOSIS — M62838 Other muscle spasm: Secondary | ICD-10-CM | POA: Diagnosis not present

## 2019-10-29 DIAGNOSIS — N393 Stress incontinence (female) (male): Secondary | ICD-10-CM | POA: Diagnosis not present

## 2019-10-29 DIAGNOSIS — M6281 Muscle weakness (generalized): Secondary | ICD-10-CM | POA: Diagnosis not present

## 2019-10-29 DIAGNOSIS — M549 Dorsalgia, unspecified: Secondary | ICD-10-CM | POA: Diagnosis not present

## 2019-10-29 DIAGNOSIS — R102 Pelvic and perineal pain: Secondary | ICD-10-CM | POA: Diagnosis not present

## 2019-11-14 DIAGNOSIS — N393 Stress incontinence (female) (male): Secondary | ICD-10-CM | POA: Diagnosis not present

## 2019-11-14 DIAGNOSIS — M62838 Other muscle spasm: Secondary | ICD-10-CM | POA: Diagnosis not present

## 2019-11-14 DIAGNOSIS — R102 Pelvic and perineal pain: Secondary | ICD-10-CM | POA: Diagnosis not present

## 2019-11-14 DIAGNOSIS — M549 Dorsalgia, unspecified: Secondary | ICD-10-CM | POA: Diagnosis not present

## 2019-11-14 DIAGNOSIS — M6281 Muscle weakness (generalized): Secondary | ICD-10-CM | POA: Diagnosis not present

## 2019-11-16 DIAGNOSIS — Z23 Encounter for immunization: Secondary | ICD-10-CM | POA: Diagnosis not present

## 2019-11-28 DIAGNOSIS — N393 Stress incontinence (female) (male): Secondary | ICD-10-CM | POA: Diagnosis not present

## 2019-11-28 DIAGNOSIS — M6281 Muscle weakness (generalized): Secondary | ICD-10-CM | POA: Diagnosis not present

## 2019-11-28 DIAGNOSIS — M62838 Other muscle spasm: Secondary | ICD-10-CM | POA: Diagnosis not present

## 2019-11-28 DIAGNOSIS — R102 Pelvic and perineal pain: Secondary | ICD-10-CM | POA: Diagnosis not present

## 2019-12-12 DIAGNOSIS — R102 Pelvic and perineal pain: Secondary | ICD-10-CM | POA: Diagnosis not present

## 2019-12-12 DIAGNOSIS — N393 Stress incontinence (female) (male): Secondary | ICD-10-CM | POA: Diagnosis not present

## 2019-12-12 DIAGNOSIS — M6281 Muscle weakness (generalized): Secondary | ICD-10-CM | POA: Diagnosis not present

## 2019-12-12 DIAGNOSIS — M62838 Other muscle spasm: Secondary | ICD-10-CM | POA: Diagnosis not present

## 2019-12-28 DIAGNOSIS — R102 Pelvic and perineal pain: Secondary | ICD-10-CM | POA: Diagnosis not present

## 2019-12-28 DIAGNOSIS — M6281 Muscle weakness (generalized): Secondary | ICD-10-CM | POA: Diagnosis not present

## 2019-12-28 DIAGNOSIS — M62838 Other muscle spasm: Secondary | ICD-10-CM | POA: Diagnosis not present

## 2020-01-01 DIAGNOSIS — Z1331 Encounter for screening for depression: Secondary | ICD-10-CM | POA: Diagnosis not present

## 2020-01-01 DIAGNOSIS — Z1211 Encounter for screening for malignant neoplasm of colon: Secondary | ICD-10-CM | POA: Diagnosis not present

## 2020-01-01 DIAGNOSIS — Z0000 Encounter for general adult medical examination without abnormal findings: Secondary | ICD-10-CM | POA: Diagnosis not present

## 2020-01-01 DIAGNOSIS — E7800 Pure hypercholesterolemia, unspecified: Secondary | ICD-10-CM | POA: Diagnosis not present

## 2020-01-01 DIAGNOSIS — Z79899 Other long term (current) drug therapy: Secondary | ICD-10-CM | POA: Diagnosis not present

## 2020-01-01 DIAGNOSIS — Z6823 Body mass index (BMI) 23.0-23.9, adult: Secondary | ICD-10-CM | POA: Diagnosis not present

## 2020-01-01 DIAGNOSIS — R0602 Shortness of breath: Secondary | ICD-10-CM | POA: Diagnosis not present

## 2020-01-01 DIAGNOSIS — Z7189 Other specified counseling: Secondary | ICD-10-CM | POA: Diagnosis not present

## 2020-01-01 DIAGNOSIS — R5383 Other fatigue: Secondary | ICD-10-CM | POA: Diagnosis not present

## 2020-01-01 DIAGNOSIS — J61 Pneumoconiosis due to asbestos and other mineral fibers: Secondary | ICD-10-CM | POA: Diagnosis not present

## 2020-01-01 DIAGNOSIS — Z1339 Encounter for screening examination for other mental health and behavioral disorders: Secondary | ICD-10-CM | POA: Diagnosis not present

## 2020-01-01 DIAGNOSIS — Z299 Encounter for prophylactic measures, unspecified: Secondary | ICD-10-CM | POA: Diagnosis not present

## 2020-01-07 DIAGNOSIS — I340 Nonrheumatic mitral (valve) insufficiency: Secondary | ICD-10-CM | POA: Diagnosis not present

## 2020-01-16 DIAGNOSIS — R102 Pelvic and perineal pain: Secondary | ICD-10-CM | POA: Diagnosis not present

## 2020-01-16 DIAGNOSIS — M6281 Muscle weakness (generalized): Secondary | ICD-10-CM | POA: Diagnosis not present

## 2020-01-16 DIAGNOSIS — C61 Malignant neoplasm of prostate: Secondary | ICD-10-CM | POA: Diagnosis not present

## 2020-01-16 DIAGNOSIS — M62838 Other muscle spasm: Secondary | ICD-10-CM | POA: Diagnosis not present

## 2020-01-16 DIAGNOSIS — N393 Stress incontinence (female) (male): Secondary | ICD-10-CM | POA: Diagnosis not present

## 2020-01-17 IMAGING — RF DG SWALLOWING FUNCTION
12 of 17 series · 12 of 24 positions shown · non-contrast
Comparison: None.

CLINICAL DATA: Tongue atrophy.  Dysphagia.

EXAM:
MODIFIED BARIUM SWALLOW
TECHNIQUE: Different consistencies of barium were administered orally to the
patient by the Speech Pathologist. Imaging of the pharynx was
performed in the lateral projection.
FLUOROSCOPY TIME:  Fluoroscopy Time:  1 minutes 46 seconds

[Series 1: run · 1 of 30 frames shown (1 of 12)]
[frame 26/30]
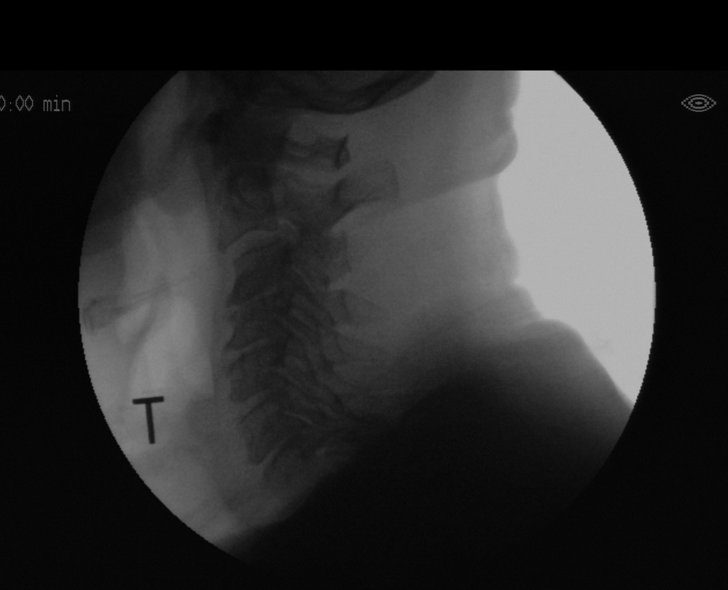

[Series 3: run · 1 of 97 frames shown (2 of 12)]
[frame 15/97]
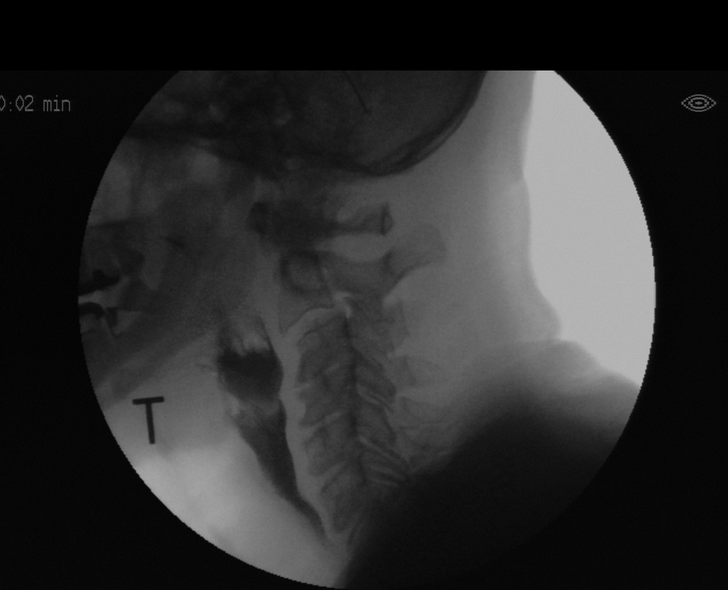

[Series 4: run · 1 of 202 frames shown (3 of 12)]
[frame 172/202]
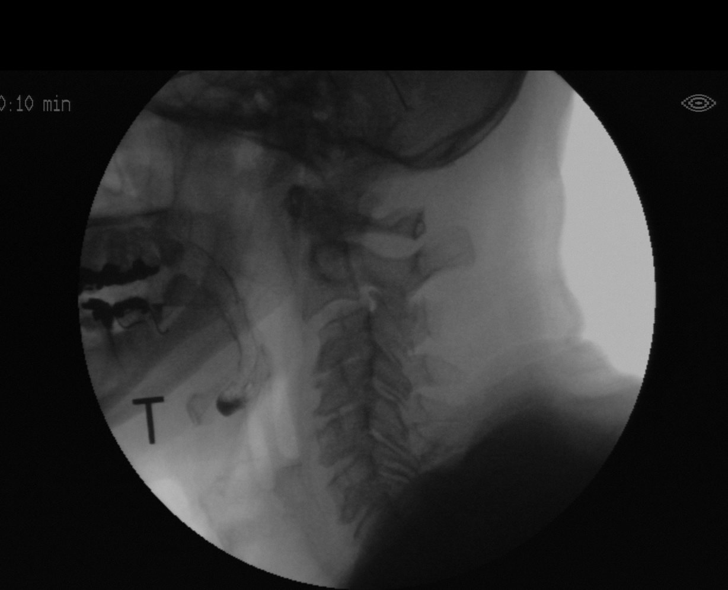

[Series 6: run · 1 of 347 frames shown (4 of 12)]
[frame 53/347]
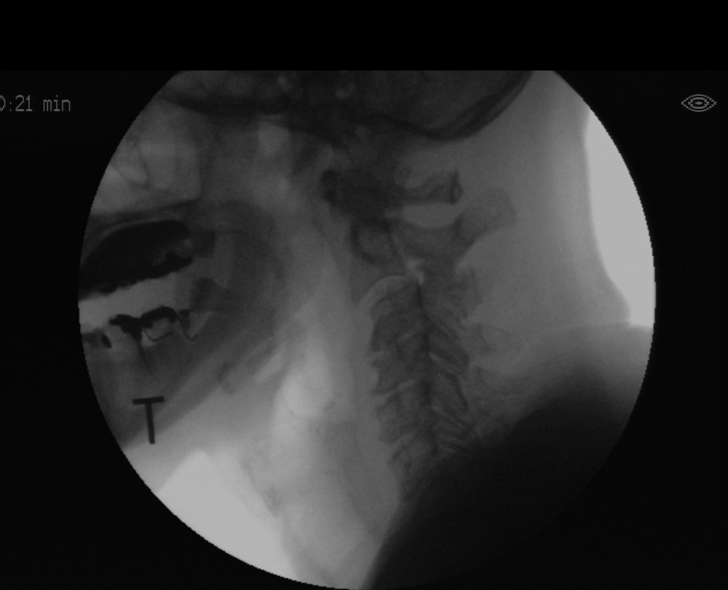

[Series 7: run · 1 of 256 frames shown (5 of 12)]
[frame 218/256]
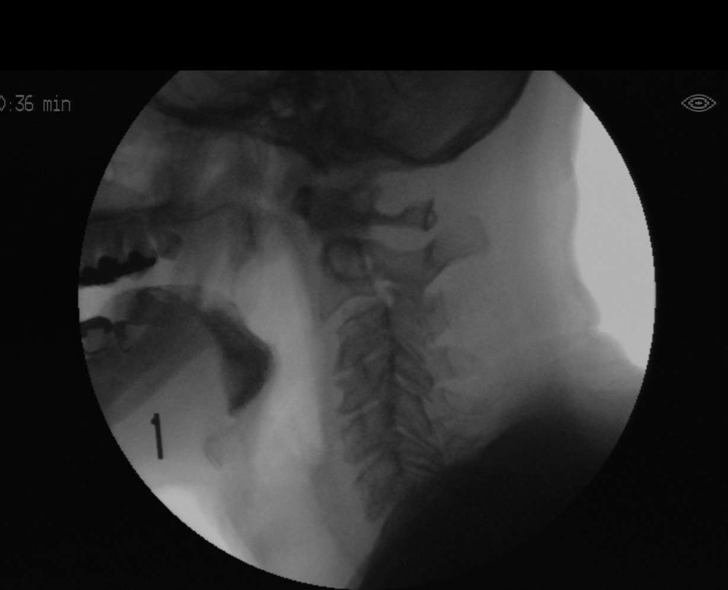

[Series 9: run · 1 of 72 frames shown (6 of 12)]
[frame 11/72]
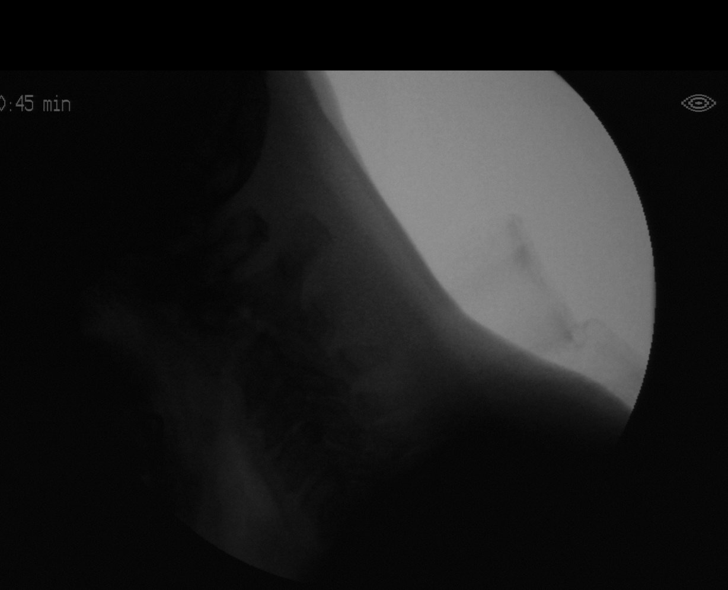

[Series 10: run · 1 of 285 frames shown (7 of 12)]
[frame 143/285]
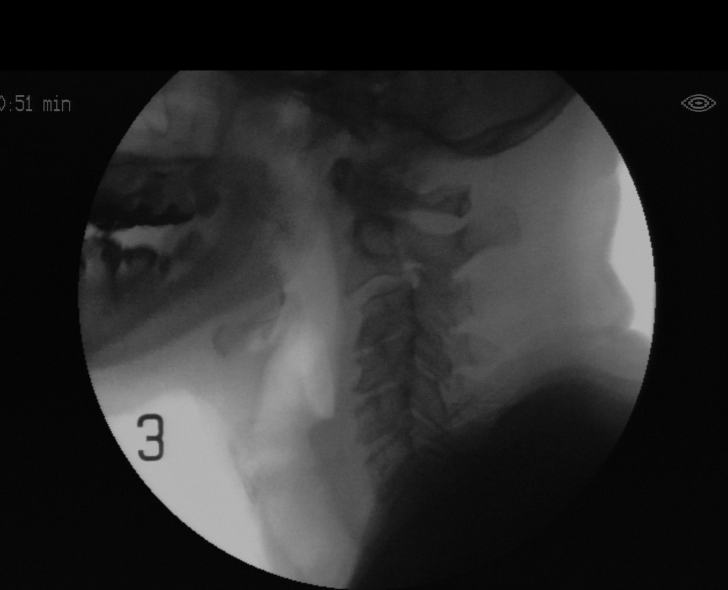

[Series 12: run · 1 of 274 frames shown (8 of 12)]
[frame 9/274]
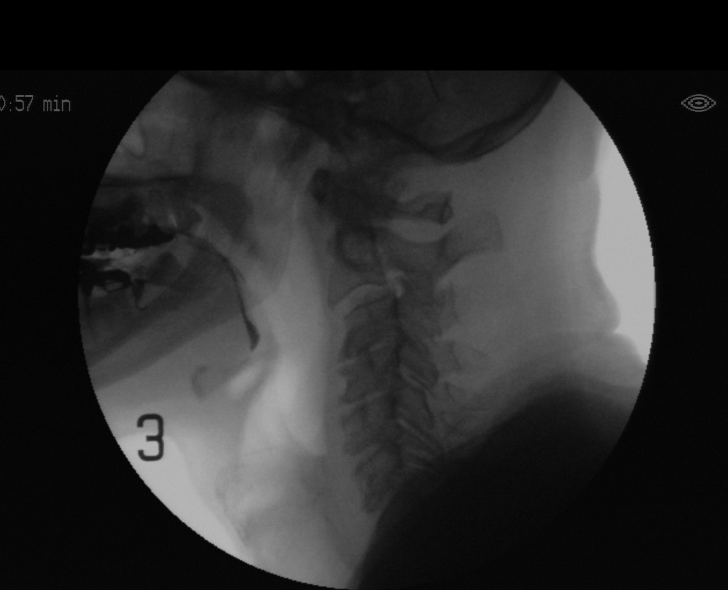

[Series 13: run · 1 of 294 frames shown (9 of 12)]
[frame 148/294]
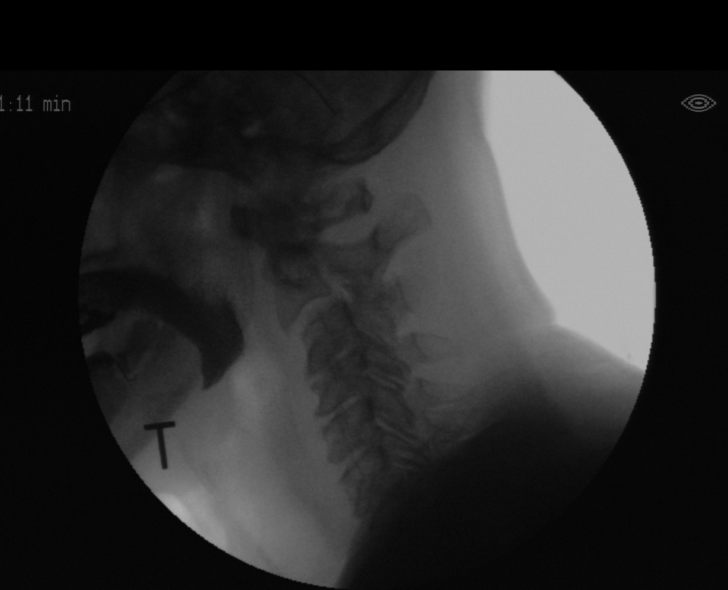

[Series 14: run · 1 of 604 frames shown (10 of 12)]
[frame 514/604]
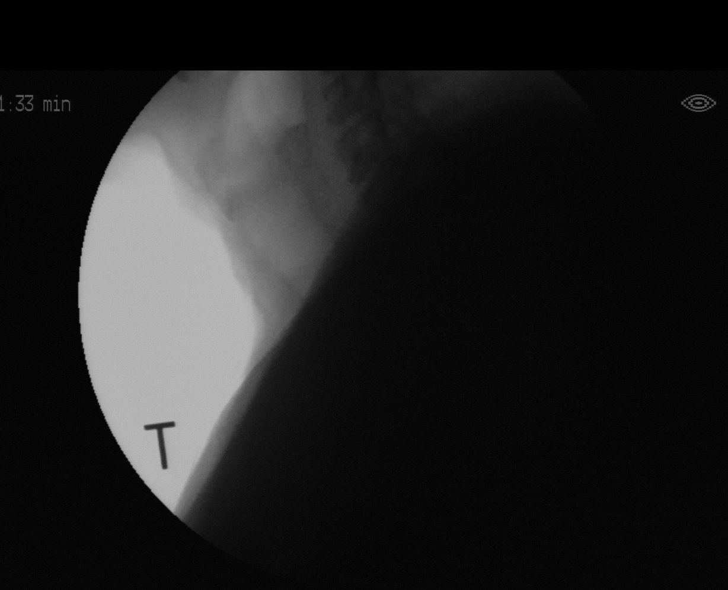

[Series 16: run · 1 of 52 frames shown (11 of 12)]
[frame 27/52]
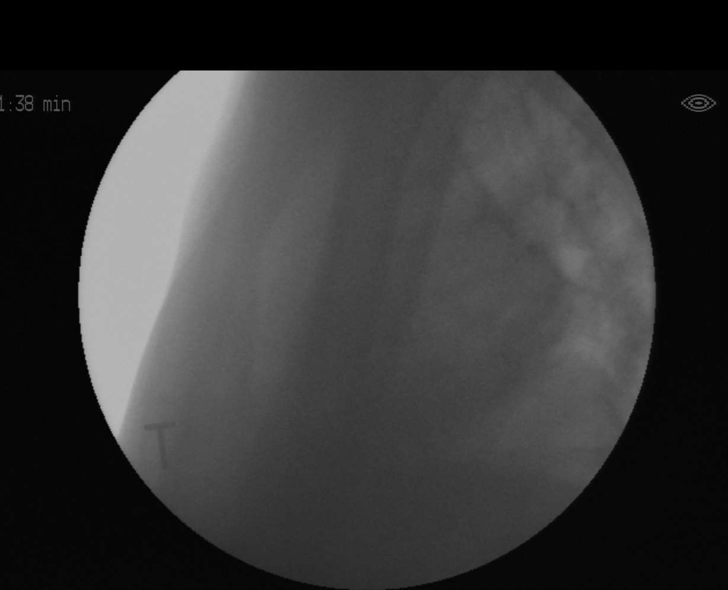

[Series 17: run · 1 of 206 frames shown (12 of 12)]
[frame 176/206]
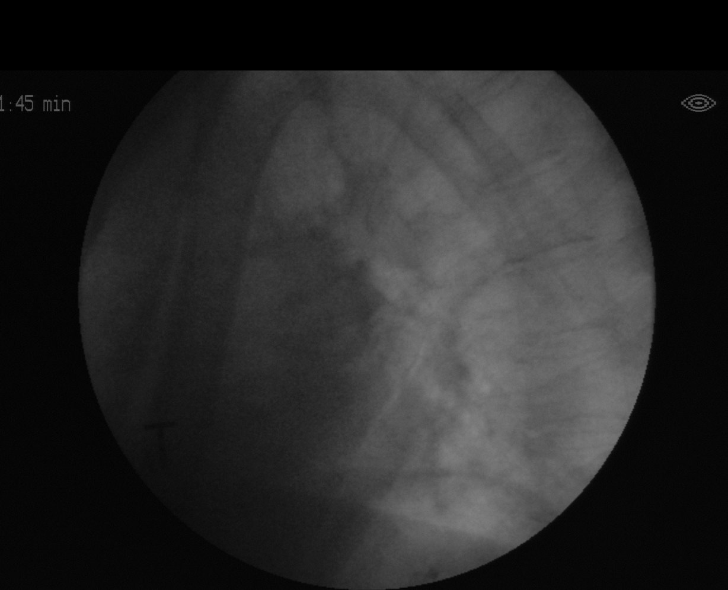

[12 of 24 positions shown; findings below may reference images not displayed]

FINDINGS: Thin liquid- within normal limits

Pure- within normal limits

Pure with cracker- within normal limits

Barium tablet -  within normal limits
IMPRESSION: Normal exam.

Please refer to the Speech Pathologists report for complete details
and recommendations.

## 2020-01-23 DIAGNOSIS — C61 Malignant neoplasm of prostate: Secondary | ICD-10-CM | POA: Diagnosis not present

## 2020-01-23 DIAGNOSIS — R102 Pelvic and perineal pain: Secondary | ICD-10-CM | POA: Diagnosis not present

## 2020-01-23 DIAGNOSIS — N393 Stress incontinence (female) (male): Secondary | ICD-10-CM | POA: Diagnosis not present

## 2020-01-30 DIAGNOSIS — Z299 Encounter for prophylactic measures, unspecified: Secondary | ICD-10-CM | POA: Diagnosis not present

## 2020-01-30 DIAGNOSIS — Z713 Dietary counseling and surveillance: Secondary | ICD-10-CM | POA: Diagnosis not present

## 2020-01-30 DIAGNOSIS — W57XXXA Bitten or stung by nonvenomous insect and other nonvenomous arthropods, initial encounter: Secondary | ICD-10-CM | POA: Diagnosis not present

## 2020-01-30 DIAGNOSIS — E7800 Pure hypercholesterolemia, unspecified: Secondary | ICD-10-CM | POA: Diagnosis not present

## 2020-01-30 DIAGNOSIS — Z6823 Body mass index (BMI) 23.0-23.9, adult: Secondary | ICD-10-CM | POA: Diagnosis not present

## 2020-01-31 DIAGNOSIS — M62838 Other muscle spasm: Secondary | ICD-10-CM | POA: Diagnosis not present

## 2020-01-31 DIAGNOSIS — R102 Pelvic and perineal pain: Secondary | ICD-10-CM | POA: Diagnosis not present

## 2020-01-31 DIAGNOSIS — M6281 Muscle weakness (generalized): Secondary | ICD-10-CM | POA: Diagnosis not present

## 2020-01-31 DIAGNOSIS — M549 Dorsalgia, unspecified: Secondary | ICD-10-CM | POA: Diagnosis not present

## 2020-01-31 DIAGNOSIS — N393 Stress incontinence (female) (male): Secondary | ICD-10-CM | POA: Diagnosis not present

## 2020-04-03 DIAGNOSIS — M6281 Muscle weakness (generalized): Secondary | ICD-10-CM | POA: Diagnosis not present

## 2020-04-03 DIAGNOSIS — M62838 Other muscle spasm: Secondary | ICD-10-CM | POA: Diagnosis not present

## 2020-04-03 DIAGNOSIS — R102 Pelvic and perineal pain: Secondary | ICD-10-CM | POA: Diagnosis not present

## 2020-04-03 DIAGNOSIS — N393 Stress incontinence (female) (male): Secondary | ICD-10-CM | POA: Diagnosis not present

## 2020-04-24 DIAGNOSIS — R102 Pelvic and perineal pain: Secondary | ICD-10-CM | POA: Diagnosis not present

## 2020-04-24 DIAGNOSIS — M6289 Other specified disorders of muscle: Secondary | ICD-10-CM | POA: Diagnosis not present

## 2020-04-24 DIAGNOSIS — M62838 Other muscle spasm: Secondary | ICD-10-CM | POA: Diagnosis not present

## 2020-04-24 DIAGNOSIS — N393 Stress incontinence (female) (male): Secondary | ICD-10-CM | POA: Diagnosis not present

## 2020-04-30 DIAGNOSIS — M6281 Muscle weakness (generalized): Secondary | ICD-10-CM | POA: Diagnosis not present

## 2020-04-30 DIAGNOSIS — M62838 Other muscle spasm: Secondary | ICD-10-CM | POA: Diagnosis not present

## 2020-04-30 DIAGNOSIS — R102 Pelvic and perineal pain: Secondary | ICD-10-CM | POA: Diagnosis not present

## 2020-04-30 DIAGNOSIS — N393 Stress incontinence (female) (male): Secondary | ICD-10-CM | POA: Diagnosis not present

## 2020-05-14 DIAGNOSIS — N393 Stress incontinence (female) (male): Secondary | ICD-10-CM | POA: Diagnosis not present

## 2020-05-14 DIAGNOSIS — R102 Pelvic and perineal pain: Secondary | ICD-10-CM | POA: Diagnosis not present

## 2020-05-14 DIAGNOSIS — M62838 Other muscle spasm: Secondary | ICD-10-CM | POA: Diagnosis not present

## 2020-05-14 DIAGNOSIS — M6281 Muscle weakness (generalized): Secondary | ICD-10-CM | POA: Diagnosis not present

## 2020-05-20 DIAGNOSIS — R102 Pelvic and perineal pain: Secondary | ICD-10-CM | POA: Diagnosis not present

## 2020-05-20 DIAGNOSIS — M6281 Muscle weakness (generalized): Secondary | ICD-10-CM | POA: Diagnosis not present

## 2020-05-20 DIAGNOSIS — N393 Stress incontinence (female) (male): Secondary | ICD-10-CM | POA: Diagnosis not present

## 2020-05-20 DIAGNOSIS — M62838 Other muscle spasm: Secondary | ICD-10-CM | POA: Diagnosis not present

## 2020-05-20 DIAGNOSIS — K5900 Constipation, unspecified: Secondary | ICD-10-CM | POA: Diagnosis not present

## 2020-06-02 DIAGNOSIS — E7800 Pure hypercholesterolemia, unspecified: Secondary | ICD-10-CM | POA: Diagnosis not present

## 2020-06-02 DIAGNOSIS — Z6823 Body mass index (BMI) 23.0-23.9, adult: Secondary | ICD-10-CM | POA: Diagnosis not present

## 2020-06-02 DIAGNOSIS — K5792 Diverticulitis of intestine, part unspecified, without perforation or abscess without bleeding: Secondary | ICD-10-CM | POA: Diagnosis not present

## 2020-06-02 DIAGNOSIS — Z87891 Personal history of nicotine dependence: Secondary | ICD-10-CM | POA: Diagnosis not present

## 2020-06-02 DIAGNOSIS — Z299 Encounter for prophylactic measures, unspecified: Secondary | ICD-10-CM | POA: Diagnosis not present

## 2020-06-02 DIAGNOSIS — K219 Gastro-esophageal reflux disease without esophagitis: Secondary | ICD-10-CM | POA: Diagnosis not present

## 2020-06-11 DIAGNOSIS — N393 Stress incontinence (female) (male): Secondary | ICD-10-CM | POA: Diagnosis not present

## 2020-06-11 DIAGNOSIS — K5900 Constipation, unspecified: Secondary | ICD-10-CM | POA: Diagnosis not present

## 2020-06-11 DIAGNOSIS — M549 Dorsalgia, unspecified: Secondary | ICD-10-CM | POA: Diagnosis not present

## 2020-06-11 DIAGNOSIS — M62838 Other muscle spasm: Secondary | ICD-10-CM | POA: Diagnosis not present

## 2020-06-11 DIAGNOSIS — M6281 Muscle weakness (generalized): Secondary | ICD-10-CM | POA: Diagnosis not present

## 2020-06-11 DIAGNOSIS — R102 Pelvic and perineal pain: Secondary | ICD-10-CM | POA: Diagnosis not present

## 2020-07-02 DIAGNOSIS — R102 Pelvic and perineal pain: Secondary | ICD-10-CM | POA: Diagnosis not present

## 2020-07-02 DIAGNOSIS — K5900 Constipation, unspecified: Secondary | ICD-10-CM | POA: Diagnosis not present

## 2020-07-02 DIAGNOSIS — M549 Dorsalgia, unspecified: Secondary | ICD-10-CM | POA: Diagnosis not present

## 2020-07-02 DIAGNOSIS — M6281 Muscle weakness (generalized): Secondary | ICD-10-CM | POA: Diagnosis not present

## 2020-07-02 DIAGNOSIS — M62838 Other muscle spasm: Secondary | ICD-10-CM | POA: Diagnosis not present

## 2020-07-03 DIAGNOSIS — Z299 Encounter for prophylactic measures, unspecified: Secondary | ICD-10-CM | POA: Diagnosis not present

## 2020-07-03 DIAGNOSIS — K5792 Diverticulitis of intestine, part unspecified, without perforation or abscess without bleeding: Secondary | ICD-10-CM | POA: Diagnosis not present

## 2020-07-03 DIAGNOSIS — Z23 Encounter for immunization: Secondary | ICD-10-CM | POA: Diagnosis not present

## 2020-07-03 DIAGNOSIS — I340 Nonrheumatic mitral (valve) insufficiency: Secondary | ICD-10-CM | POA: Diagnosis not present

## 2020-07-07 DIAGNOSIS — J309 Allergic rhinitis, unspecified: Secondary | ICD-10-CM | POA: Diagnosis not present

## 2020-07-23 DIAGNOSIS — N393 Stress incontinence (female) (male): Secondary | ICD-10-CM | POA: Diagnosis not present

## 2020-07-23 DIAGNOSIS — M62838 Other muscle spasm: Secondary | ICD-10-CM | POA: Diagnosis not present

## 2020-07-23 DIAGNOSIS — R102 Pelvic and perineal pain: Secondary | ICD-10-CM | POA: Diagnosis not present

## 2020-07-23 DIAGNOSIS — K5900 Constipation, unspecified: Secondary | ICD-10-CM | POA: Diagnosis not present

## 2020-07-23 DIAGNOSIS — M6281 Muscle weakness (generalized): Secondary | ICD-10-CM | POA: Diagnosis not present

## 2020-08-13 DIAGNOSIS — C61 Malignant neoplasm of prostate: Secondary | ICD-10-CM | POA: Diagnosis not present

## 2020-08-13 DIAGNOSIS — M62838 Other muscle spasm: Secondary | ICD-10-CM | POA: Diagnosis not present

## 2020-08-13 DIAGNOSIS — R102 Pelvic and perineal pain: Secondary | ICD-10-CM | POA: Diagnosis not present

## 2020-08-13 DIAGNOSIS — M6281 Muscle weakness (generalized): Secondary | ICD-10-CM | POA: Diagnosis not present

## 2020-08-13 DIAGNOSIS — K5900 Constipation, unspecified: Secondary | ICD-10-CM | POA: Diagnosis not present

## 2020-08-27 DIAGNOSIS — N393 Stress incontinence (female) (male): Secondary | ICD-10-CM | POA: Diagnosis not present

## 2020-08-27 DIAGNOSIS — R102 Pelvic and perineal pain: Secondary | ICD-10-CM | POA: Diagnosis not present

## 2020-08-27 DIAGNOSIS — Z23 Encounter for immunization: Secondary | ICD-10-CM | POA: Diagnosis not present

## 2020-08-27 DIAGNOSIS — C61 Malignant neoplasm of prostate: Secondary | ICD-10-CM | POA: Diagnosis not present

## 2020-09-03 DIAGNOSIS — K5900 Constipation, unspecified: Secondary | ICD-10-CM | POA: Diagnosis not present

## 2020-09-03 DIAGNOSIS — N393 Stress incontinence (female) (male): Secondary | ICD-10-CM | POA: Diagnosis not present

## 2020-09-03 DIAGNOSIS — M6281 Muscle weakness (generalized): Secondary | ICD-10-CM | POA: Diagnosis not present

## 2020-09-03 DIAGNOSIS — M62838 Other muscle spasm: Secondary | ICD-10-CM | POA: Diagnosis not present

## 2020-09-03 DIAGNOSIS — R102 Pelvic and perineal pain: Secondary | ICD-10-CM | POA: Diagnosis not present

## 2020-09-24 DIAGNOSIS — M62838 Other muscle spasm: Secondary | ICD-10-CM | POA: Diagnosis not present

## 2020-09-24 DIAGNOSIS — M549 Dorsalgia, unspecified: Secondary | ICD-10-CM | POA: Diagnosis not present

## 2020-09-24 DIAGNOSIS — K5900 Constipation, unspecified: Secondary | ICD-10-CM | POA: Diagnosis not present

## 2020-09-24 DIAGNOSIS — N393 Stress incontinence (female) (male): Secondary | ICD-10-CM | POA: Diagnosis not present

## 2020-09-24 DIAGNOSIS — M6281 Muscle weakness (generalized): Secondary | ICD-10-CM | POA: Diagnosis not present

## 2020-09-24 DIAGNOSIS — R102 Pelvic and perineal pain: Secondary | ICD-10-CM | POA: Diagnosis not present

## 2020-10-08 DIAGNOSIS — H04123 Dry eye syndrome of bilateral lacrimal glands: Secondary | ICD-10-CM | POA: Diagnosis not present

## 2020-10-08 DIAGNOSIS — H31121 Diffuse secondary atrophy of choroid, right eye: Secondary | ICD-10-CM | POA: Diagnosis not present

## 2020-10-15 DIAGNOSIS — K5900 Constipation, unspecified: Secondary | ICD-10-CM | POA: Diagnosis not present

## 2020-10-15 DIAGNOSIS — M62838 Other muscle spasm: Secondary | ICD-10-CM | POA: Diagnosis not present

## 2020-10-15 DIAGNOSIS — R102 Pelvic and perineal pain: Secondary | ICD-10-CM | POA: Diagnosis not present

## 2020-10-15 DIAGNOSIS — N393 Stress incontinence (female) (male): Secondary | ICD-10-CM | POA: Diagnosis not present

## 2020-10-15 DIAGNOSIS — M6281 Muscle weakness (generalized): Secondary | ICD-10-CM | POA: Diagnosis not present

## 2020-11-05 DIAGNOSIS — K5900 Constipation, unspecified: Secondary | ICD-10-CM | POA: Diagnosis not present

## 2020-11-05 DIAGNOSIS — N393 Stress incontinence (female) (male): Secondary | ICD-10-CM | POA: Diagnosis not present

## 2020-11-05 DIAGNOSIS — M62838 Other muscle spasm: Secondary | ICD-10-CM | POA: Diagnosis not present

## 2020-11-05 DIAGNOSIS — R102 Pelvic and perineal pain: Secondary | ICD-10-CM | POA: Diagnosis not present

## 2020-11-05 DIAGNOSIS — M6281 Muscle weakness (generalized): Secondary | ICD-10-CM | POA: Diagnosis not present

## 2020-11-12 DIAGNOSIS — R102 Pelvic and perineal pain: Secondary | ICD-10-CM | POA: Diagnosis not present

## 2020-11-12 DIAGNOSIS — K5900 Constipation, unspecified: Secondary | ICD-10-CM | POA: Diagnosis not present

## 2020-11-12 DIAGNOSIS — M62838 Other muscle spasm: Secondary | ICD-10-CM | POA: Diagnosis not present

## 2020-11-12 DIAGNOSIS — M6281 Muscle weakness (generalized): Secondary | ICD-10-CM | POA: Diagnosis not present

## 2020-11-26 DIAGNOSIS — M6281 Muscle weakness (generalized): Secondary | ICD-10-CM | POA: Diagnosis not present

## 2020-11-26 DIAGNOSIS — K5900 Constipation, unspecified: Secondary | ICD-10-CM | POA: Diagnosis not present

## 2020-11-26 DIAGNOSIS — R102 Pelvic and perineal pain: Secondary | ICD-10-CM | POA: Diagnosis not present

## 2020-11-26 DIAGNOSIS — M62838 Other muscle spasm: Secondary | ICD-10-CM | POA: Diagnosis not present

## 2020-12-17 DIAGNOSIS — N393 Stress incontinence (female) (male): Secondary | ICD-10-CM | POA: Diagnosis not present

## 2020-12-17 DIAGNOSIS — M6281 Muscle weakness (generalized): Secondary | ICD-10-CM | POA: Diagnosis not present

## 2020-12-17 DIAGNOSIS — M62838 Other muscle spasm: Secondary | ICD-10-CM | POA: Diagnosis not present

## 2020-12-17 DIAGNOSIS — R102 Pelvic and perineal pain: Secondary | ICD-10-CM | POA: Diagnosis not present

## 2021-01-07 DIAGNOSIS — R102 Pelvic and perineal pain: Secondary | ICD-10-CM | POA: Diagnosis not present

## 2021-01-07 DIAGNOSIS — M6281 Muscle weakness (generalized): Secondary | ICD-10-CM | POA: Diagnosis not present

## 2021-01-07 DIAGNOSIS — M62838 Other muscle spasm: Secondary | ICD-10-CM | POA: Diagnosis not present

## 2021-01-07 DIAGNOSIS — K5900 Constipation, unspecified: Secondary | ICD-10-CM | POA: Diagnosis not present

## 2021-01-21 DIAGNOSIS — Z7189 Other specified counseling: Secondary | ICD-10-CM | POA: Diagnosis not present

## 2021-01-21 DIAGNOSIS — Z1339 Encounter for screening examination for other mental health and behavioral disorders: Secondary | ICD-10-CM | POA: Diagnosis not present

## 2021-01-21 DIAGNOSIS — M533 Sacrococcygeal disorders, not elsewhere classified: Secondary | ICD-10-CM | POA: Diagnosis not present

## 2021-01-21 DIAGNOSIS — Z299 Encounter for prophylactic measures, unspecified: Secondary | ICD-10-CM | POA: Diagnosis not present

## 2021-01-21 DIAGNOSIS — R52 Pain, unspecified: Secondary | ICD-10-CM | POA: Diagnosis not present

## 2021-01-21 DIAGNOSIS — Z1331 Encounter for screening for depression: Secondary | ICD-10-CM | POA: Diagnosis not present

## 2021-01-21 DIAGNOSIS — Z6823 Body mass index (BMI) 23.0-23.9, adult: Secondary | ICD-10-CM | POA: Diagnosis not present

## 2021-01-21 DIAGNOSIS — Z0000 Encounter for general adult medical examination without abnormal findings: Secondary | ICD-10-CM | POA: Diagnosis not present

## 2021-01-22 DIAGNOSIS — Z125 Encounter for screening for malignant neoplasm of prostate: Secondary | ICD-10-CM | POA: Diagnosis not present

## 2021-01-22 DIAGNOSIS — R5383 Other fatigue: Secondary | ICD-10-CM | POA: Diagnosis not present

## 2021-01-22 DIAGNOSIS — E7800 Pure hypercholesterolemia, unspecified: Secondary | ICD-10-CM | POA: Diagnosis not present

## 2021-01-22 DIAGNOSIS — Z79899 Other long term (current) drug therapy: Secondary | ICD-10-CM | POA: Diagnosis not present

## 2021-01-28 DIAGNOSIS — M6281 Muscle weakness (generalized): Secondary | ICD-10-CM | POA: Diagnosis not present

## 2021-01-28 DIAGNOSIS — R102 Pelvic and perineal pain: Secondary | ICD-10-CM | POA: Diagnosis not present

## 2021-01-28 DIAGNOSIS — M62838 Other muscle spasm: Secondary | ICD-10-CM | POA: Diagnosis not present

## 2021-01-28 DIAGNOSIS — K5900 Constipation, unspecified: Secondary | ICD-10-CM | POA: Diagnosis not present

## 2021-02-19 DIAGNOSIS — R102 Pelvic and perineal pain: Secondary | ICD-10-CM | POA: Diagnosis not present

## 2021-02-19 DIAGNOSIS — M62838 Other muscle spasm: Secondary | ICD-10-CM | POA: Diagnosis not present

## 2021-02-19 DIAGNOSIS — M6281 Muscle weakness (generalized): Secondary | ICD-10-CM | POA: Diagnosis not present

## 2021-02-19 DIAGNOSIS — K5900 Constipation, unspecified: Secondary | ICD-10-CM | POA: Diagnosis not present

## 2021-03-11 DIAGNOSIS — C61 Malignant neoplasm of prostate: Secondary | ICD-10-CM | POA: Diagnosis not present

## 2021-03-11 DIAGNOSIS — M6281 Muscle weakness (generalized): Secondary | ICD-10-CM | POA: Diagnosis not present

## 2021-03-11 DIAGNOSIS — R102 Pelvic and perineal pain: Secondary | ICD-10-CM | POA: Diagnosis not present

## 2021-03-11 DIAGNOSIS — K5900 Constipation, unspecified: Secondary | ICD-10-CM | POA: Diagnosis not present

## 2021-03-11 DIAGNOSIS — M62838 Other muscle spasm: Secondary | ICD-10-CM | POA: Diagnosis not present

## 2021-03-11 DIAGNOSIS — N393 Stress incontinence (female) (male): Secondary | ICD-10-CM | POA: Diagnosis not present

## 2021-03-27 DIAGNOSIS — C61 Malignant neoplasm of prostate: Secondary | ICD-10-CM | POA: Diagnosis not present

## 2021-03-27 DIAGNOSIS — N393 Stress incontinence (female) (male): Secondary | ICD-10-CM | POA: Diagnosis not present

## 2021-03-27 DIAGNOSIS — R102 Pelvic and perineal pain: Secondary | ICD-10-CM | POA: Diagnosis not present

## 2021-04-07 ENCOUNTER — Ambulatory Visit (INDEPENDENT_AMBULATORY_CARE_PROVIDER_SITE_OTHER): Payer: Medicare Other | Admitting: Dermatology

## 2021-04-07 ENCOUNTER — Other Ambulatory Visit: Payer: Self-pay

## 2021-04-07 DIAGNOSIS — L570 Actinic keratosis: Secondary | ICD-10-CM

## 2021-04-07 DIAGNOSIS — Z1283 Encounter for screening for malignant neoplasm of skin: Secondary | ICD-10-CM | POA: Diagnosis not present

## 2021-04-07 DIAGNOSIS — L821 Other seborrheic keratosis: Secondary | ICD-10-CM

## 2021-04-07 DIAGNOSIS — R102 Pelvic and perineal pain: Secondary | ICD-10-CM | POA: Diagnosis not present

## 2021-04-07 DIAGNOSIS — M62838 Other muscle spasm: Secondary | ICD-10-CM | POA: Diagnosis not present

## 2021-04-07 DIAGNOSIS — C44519 Basal cell carcinoma of skin of other part of trunk: Secondary | ICD-10-CM

## 2021-04-07 DIAGNOSIS — K5900 Constipation, unspecified: Secondary | ICD-10-CM | POA: Diagnosis not present

## 2021-04-07 DIAGNOSIS — C4491 Basal cell carcinoma of skin, unspecified: Secondary | ICD-10-CM

## 2021-04-07 DIAGNOSIS — M6281 Muscle weakness (generalized): Secondary | ICD-10-CM | POA: Diagnosis not present

## 2021-04-07 DIAGNOSIS — D485 Neoplasm of uncertain behavior of skin: Secondary | ICD-10-CM | POA: Diagnosis not present

## 2021-04-07 HISTORY — DX: Basal cell carcinoma of skin, unspecified: C44.91

## 2021-04-07 NOTE — Patient Instructions (Signed)

## 2021-04-08 DIAGNOSIS — H53122 Transient visual loss, left eye: Secondary | ICD-10-CM | POA: Diagnosis not present

## 2021-04-08 DIAGNOSIS — H31121 Diffuse secondary atrophy of choroid, right eye: Secondary | ICD-10-CM | POA: Diagnosis not present

## 2021-04-08 DIAGNOSIS — H04123 Dry eye syndrome of bilateral lacrimal glands: Secondary | ICD-10-CM | POA: Diagnosis not present

## 2021-04-14 ENCOUNTER — Telehealth: Payer: Self-pay

## 2021-04-14 NOTE — Telephone Encounter (Signed)
-----   Message from Lavonna Monarch, MD sent at 04/10/2021  6:58 PM EDT ----- Schedule surgery with Dr. Darene Lamer

## 2021-04-14 NOTE — Telephone Encounter (Signed)
Phone call from patient returning our call. Pathology results given to patient.  

## 2021-04-14 NOTE — Telephone Encounter (Signed)
Phone call to patient with his pathology results. Voicemail left for patient to give the office a call back.  ?

## 2021-04-25 ENCOUNTER — Encounter: Payer: Self-pay | Admitting: Dermatology

## 2021-04-25 NOTE — Progress Notes (Addendum)
   Follow-Up Visit   Subjective  Blake Moss is a 70 y.o. male who presents for the following: Annual Exam (Here for annual skin exam. Concerns right side face that's crusty. Also had lesion on chest that would not heal but per patient it has finally healed up. ).  General skin examination, check places on face and chest Location:  Duration:  Quality:  Associated Signs/Symptoms: Modifying Factors:  Severity:  Timing: Context:   Objective  Well appearing patient in no apparent distress; mood and affect are within normal limits. Ankle Up Ankle up skin examination today: no signs of atypical moles, or melanoma.  2 possible nonmelanoma skin cancers on face and chest will be biopsied.  In  Left Mid Chest Waxy pink 6 mm papule, suspect superficial BCC.  In       Right Temporal Scalp 5 mm pink crust, rule out carcinoma in situ.       Mid Back Multiple brown flattopped textured 3 to 6 mm nodules  Left Malar Cheek 4 mm gritty pink crust   A full examination was performed including scalp, head, eyes, ears, nose, lips, neck, chest, axillae, abdomen, back, buttocks, bilateral upper extremities, bilateral lower extremities, hands, feet, fingers, toes, fingernails, and toenails. All findings within normal limits unless otherwise noted below.   Assessment & Plan    Skin exam for malignant neoplasm Ankle Up  Yearly skin check  Neoplasm of uncertain behavior of skin (2) Left Mid Chest  Skin / nail biopsy Type of biopsy: tangential   Informed consent: discussed and consent obtained   Timeout: patient name, date of birth, surgical site, and procedure verified   Procedure prep:  Patient was prepped and draped in usual sterile fashion (Non sterile) Prep type:  Chlorhexidine Anesthesia: the lesion was anesthetized in a standard fashion   Anesthetic:  1% lidocaine w/ epinephrine 1-100,000 local infiltration Instrument used: flexible razor blade   Outcome: patient  tolerated procedure well   Post-procedure details: wound care instructions given    Specimen 1 - Surgical pathology Differential Diagnosis: R/O BCC vs SCC  Check Margins: No  Right Temporal Scalp  Skin / nail biopsy Type of biopsy: tangential   Informed consent: discussed and consent obtained   Timeout: patient name, date of birth, surgical site, and procedure verified   Anesthesia: the lesion was anesthetized in a standard fashion   Anesthetic:  1% lidocaine w/ epinephrine 1-100,000 local infiltration Instrument used: flexible razor blade   Hemostasis achieved with: ferric subsulfate   Outcome: patient tolerated procedure well   Post-procedure details: sterile dressing applied and wound care instructions given   Dressing type: bandage and petrolatum    Specimen 2 - Surgical pathology Differential Diagnosis: R/O BCC vs SCC  Check Margins: No  Seborrheic keratosis Mid Back  Benign okay to leave if stable   AK (actinic keratosis) Left Malar Cheek  Destruction of lesion - Left Malar Cheek Complexity: simple   Destruction method: cryotherapy   Informed consent: discussed and consent obtained   Timeout:  patient name, date of birth, surgical site, and procedure verified Lesion destroyed using liquid nitrogen: Yes   Cryotherapy cycles:  3 Outcome: patient tolerated procedure well with no complications   Post-procedure details: wound care instructions given       I, Lavonna Monarch, MD, have reviewed all documentation for this visit.  The documentation on 06/07/21 for the exam, diagnosis, procedures, and orders are all accurate and complete.

## 2021-05-05 DIAGNOSIS — R102 Pelvic and perineal pain: Secondary | ICD-10-CM | POA: Diagnosis not present

## 2021-05-05 DIAGNOSIS — N393 Stress incontinence (female) (male): Secondary | ICD-10-CM | POA: Diagnosis not present

## 2021-05-05 DIAGNOSIS — M6281 Muscle weakness (generalized): Secondary | ICD-10-CM | POA: Diagnosis not present

## 2021-05-05 DIAGNOSIS — K5900 Constipation, unspecified: Secondary | ICD-10-CM | POA: Diagnosis not present

## 2021-05-05 DIAGNOSIS — M62838 Other muscle spasm: Secondary | ICD-10-CM | POA: Diagnosis not present

## 2021-05-07 ENCOUNTER — Other Ambulatory Visit: Payer: Self-pay

## 2021-05-07 ENCOUNTER — Ambulatory Visit (INDEPENDENT_AMBULATORY_CARE_PROVIDER_SITE_OTHER): Payer: Medicare Other | Admitting: Dermatology

## 2021-05-07 ENCOUNTER — Encounter: Payer: Self-pay | Admitting: Dermatology

## 2021-05-07 DIAGNOSIS — D485 Neoplasm of uncertain behavior of skin: Secondary | ICD-10-CM | POA: Diagnosis not present

## 2021-05-07 DIAGNOSIS — L859 Epidermal thickening, unspecified: Secondary | ICD-10-CM | POA: Diagnosis not present

## 2021-05-07 DIAGNOSIS — C44519 Basal cell carcinoma of skin of other part of trunk: Secondary | ICD-10-CM

## 2021-05-07 DIAGNOSIS — C44511 Basal cell carcinoma of skin of breast: Secondary | ICD-10-CM

## 2021-05-07 NOTE — Patient Instructions (Signed)

## 2021-05-16 ENCOUNTER — Encounter: Payer: Self-pay | Admitting: Dermatology

## 2021-05-16 NOTE — Progress Notes (Signed)
   Follow-Up Visit   Subjective  Blake Moss is a 70 y.o. male who presents for the following: Procedure (Patient here today for treatment of BCC x 1 left mid chest. ).  BCC chest plus new lesion behind shoulder. Location:  Duration:  Quality:  Associated Signs/Symptoms: Modifying Factors:  Severity:  Timing: Context:   Objective  Well appearing patient in no apparent distress; mood and affect are within normal limits. Left Mid Chest Lesion identified by Dr.Calbert Hulsebus and nurse in room.    Right Posterior Shoulder Waxy 1 cm pink crust compatible with superficial carcinoma         All skin waist up examined.   Assessment & Plan    Basal cell carcinoma (BCC) of skin of left breast Left Mid Chest  Destruction of lesion Complexity: simple   Destruction method: electrodesiccation and curettage   Informed consent: discussed and consent obtained   Timeout:  patient name, date of birth, surgical site, and procedure verified Anesthesia: the lesion was anesthetized in a standard fashion   Anesthetic:  1% lidocaine w/ epinephrine 1-100,000 local infiltration Curettage performed in three different directions: Yes     Electrodesiccation performed over the curetted area: No   Curettage cycles:  3 Lesion length (cm):  1 Lesion width (cm):  1 Margin per side (cm):  0 Final wound size (cm):  1 Hemostasis achieved with:  ferric subsulfate Outcome: patient tolerated procedure well with no complications   Post-procedure details: sterile dressing applied and wound care instructions given   Dressing type: bandage and petrolatum   Additional details:  Wound innoculated with 5 fluorouracil solution.  Neoplasm of uncertain behavior of skin Right Posterior Shoulder  Skin / nail biopsy Type of biopsy: tangential   Informed consent: discussed and consent obtained   Timeout: patient name, date of birth, surgical site, and procedure verified   Procedure prep:  Patient was prepped  and draped in usual sterile fashion (Non sterile) Prep type:  Chlorhexidine Anesthesia: the lesion was anesthetized in a standard fashion   Anesthetic:  1% lidocaine w/ epinephrine 1-100,000 local infiltration Instrument used: flexible razor blade   Outcome: patient tolerated procedure well   Post-procedure details: wound care instructions given    Destruction of lesion Complexity: simple   Destruction method: electrodesiccation and curettage   Informed consent: discussed and consent obtained   Timeout:  patient name, date of birth, surgical site, and procedure verified Anesthesia: the lesion was anesthetized in a standard fashion   Anesthetic:  1% lidocaine w/ epinephrine 1-100,000 local infiltration Curettage performed in three different directions: Yes     Electrodesiccation performed over the curetted area: No   Curettage cycles:  2 Margin per side (cm):  0.1 Final wound size (cm):  1.3 Hemostasis achieved with:  ferric subsulfate Outcome: patient tolerated procedure well with no complications   Post-procedure details: sterile dressing applied and wound care instructions given   Dressing type: bandage and petrolatum   Additional details:  Wound innoculated with 5 fluorouracil solution.  Specimen 1 - Surgical pathology Differential Diagnosis: R/O BCC vs SCC - treated after biopsy  Check Margins: No  After shave biopsy the base of the lesion was treated with curettage plus cautery      I, Lavonna Monarch, MD, have reviewed all documentation for this visit.  The documentation on 05/16/21 for the exam, diagnosis, procedures, and orders are all accurate and complete.

## 2021-06-02 DIAGNOSIS — N393 Stress incontinence (female) (male): Secondary | ICD-10-CM | POA: Diagnosis not present

## 2021-06-02 DIAGNOSIS — M62838 Other muscle spasm: Secondary | ICD-10-CM | POA: Diagnosis not present

## 2021-06-02 DIAGNOSIS — K5900 Constipation, unspecified: Secondary | ICD-10-CM | POA: Diagnosis not present

## 2021-06-02 DIAGNOSIS — R102 Pelvic and perineal pain: Secondary | ICD-10-CM | POA: Diagnosis not present

## 2021-06-02 DIAGNOSIS — M6281 Muscle weakness (generalized): Secondary | ICD-10-CM | POA: Diagnosis not present

## 2021-06-18 ENCOUNTER — Encounter: Payer: Medicare Other | Admitting: Dermatology

## 2021-06-30 DIAGNOSIS — K5900 Constipation, unspecified: Secondary | ICD-10-CM | POA: Diagnosis not present

## 2021-06-30 DIAGNOSIS — M62838 Other muscle spasm: Secondary | ICD-10-CM | POA: Diagnosis not present

## 2021-06-30 DIAGNOSIS — M6281 Muscle weakness (generalized): Secondary | ICD-10-CM | POA: Diagnosis not present

## 2021-06-30 DIAGNOSIS — R102 Pelvic and perineal pain: Secondary | ICD-10-CM | POA: Diagnosis not present

## 2021-07-23 DIAGNOSIS — R102 Pelvic and perineal pain: Secondary | ICD-10-CM | POA: Diagnosis not present

## 2021-07-23 DIAGNOSIS — M6281 Muscle weakness (generalized): Secondary | ICD-10-CM | POA: Diagnosis not present

## 2021-07-23 DIAGNOSIS — K5900 Constipation, unspecified: Secondary | ICD-10-CM | POA: Diagnosis not present

## 2021-07-23 DIAGNOSIS — M62838 Other muscle spasm: Secondary | ICD-10-CM | POA: Diagnosis not present

## 2021-08-04 DIAGNOSIS — Z23 Encounter for immunization: Secondary | ICD-10-CM | POA: Diagnosis not present

## 2021-08-10 DIAGNOSIS — Z6823 Body mass index (BMI) 23.0-23.9, adult: Secondary | ICD-10-CM | POA: Diagnosis not present

## 2021-08-10 DIAGNOSIS — J069 Acute upper respiratory infection, unspecified: Secondary | ICD-10-CM | POA: Diagnosis not present

## 2021-08-10 DIAGNOSIS — Z713 Dietary counseling and surveillance: Secondary | ICD-10-CM | POA: Diagnosis not present

## 2021-08-10 DIAGNOSIS — Z299 Encounter for prophylactic measures, unspecified: Secondary | ICD-10-CM | POA: Diagnosis not present

## 2021-08-19 DIAGNOSIS — M62838 Other muscle spasm: Secondary | ICD-10-CM | POA: Diagnosis not present

## 2021-08-19 DIAGNOSIS — K5900 Constipation, unspecified: Secondary | ICD-10-CM | POA: Diagnosis not present

## 2021-08-19 DIAGNOSIS — R102 Pelvic and perineal pain: Secondary | ICD-10-CM | POA: Diagnosis not present

## 2021-08-19 DIAGNOSIS — M6281 Muscle weakness (generalized): Secondary | ICD-10-CM | POA: Diagnosis not present

## 2021-09-08 ENCOUNTER — Ambulatory Visit: Payer: Medicare Other | Admitting: Dermatology

## 2021-09-14 ENCOUNTER — Ambulatory Visit (INDEPENDENT_AMBULATORY_CARE_PROVIDER_SITE_OTHER): Payer: Medicare Other | Admitting: Dermatology

## 2021-09-14 ENCOUNTER — Other Ambulatory Visit: Payer: Self-pay

## 2021-09-14 DIAGNOSIS — L219 Seborrheic dermatitis, unspecified: Secondary | ICD-10-CM | POA: Diagnosis not present

## 2021-09-14 DIAGNOSIS — Z85828 Personal history of other malignant neoplasm of skin: Secondary | ICD-10-CM | POA: Diagnosis not present

## 2021-09-14 DIAGNOSIS — L821 Other seborrheic keratosis: Secondary | ICD-10-CM

## 2021-09-14 DIAGNOSIS — C44729 Squamous cell carcinoma of skin of left lower limb, including hip: Secondary | ICD-10-CM | POA: Diagnosis not present

## 2021-09-14 DIAGNOSIS — D485 Neoplasm of uncertain behavior of skin: Secondary | ICD-10-CM

## 2021-09-14 MED ORDER — HYDROCORTISONE 2.5 % EX OINT: TOPICAL_OINTMENT | CUTANEOUS | 0 refills | Status: AC

## 2021-09-14 NOTE — Patient Instructions (Signed)

## 2021-09-16 DIAGNOSIS — R102 Pelvic and perineal pain: Secondary | ICD-10-CM | POA: Diagnosis not present

## 2021-09-16 DIAGNOSIS — M62838 Other muscle spasm: Secondary | ICD-10-CM | POA: Diagnosis not present

## 2021-09-16 DIAGNOSIS — K5900 Constipation, unspecified: Secondary | ICD-10-CM | POA: Diagnosis not present

## 2021-09-16 DIAGNOSIS — M6281 Muscle weakness (generalized): Secondary | ICD-10-CM | POA: Diagnosis not present

## 2021-09-28 ENCOUNTER — Telehealth: Payer: Self-pay | Admitting: Dermatology

## 2021-09-28 NOTE — Telephone Encounter (Signed)
Results, ST 

## 2021-09-28 NOTE — Telephone Encounter (Signed)
Phone call from patient wanting his pathology results. Patient aware of results.  

## 2021-10-05 ENCOUNTER — Encounter (INDEPENDENT_AMBULATORY_CARE_PROVIDER_SITE_OTHER): Payer: Self-pay | Admitting: *Deleted

## 2021-10-07 ENCOUNTER — Encounter: Payer: Self-pay | Admitting: Dermatology

## 2021-10-07 DIAGNOSIS — H31093 Other chorioretinal scars, bilateral: Secondary | ICD-10-CM | POA: Diagnosis not present

## 2021-10-07 DIAGNOSIS — K5900 Constipation, unspecified: Secondary | ICD-10-CM | POA: Diagnosis not present

## 2021-10-07 DIAGNOSIS — R102 Pelvic and perineal pain: Secondary | ICD-10-CM | POA: Diagnosis not present

## 2021-10-07 DIAGNOSIS — H353114 Nonexudative age-related macular degeneration, right eye, advanced atrophic with subfoveal involvement: Secondary | ICD-10-CM | POA: Diagnosis not present

## 2021-10-07 DIAGNOSIS — M62838 Other muscle spasm: Secondary | ICD-10-CM | POA: Diagnosis not present

## 2021-10-07 DIAGNOSIS — H353121 Nonexudative age-related macular degeneration, left eye, early dry stage: Secondary | ICD-10-CM | POA: Diagnosis not present

## 2021-10-07 DIAGNOSIS — M6281 Muscle weakness (generalized): Secondary | ICD-10-CM | POA: Diagnosis not present

## 2021-10-07 NOTE — Progress Notes (Signed)
° °  Follow-Up Visit   Subjective  Blake Moss is a 71 y.o. male who presents for the following: Follow-up (Here for 4 month follow up on left chest BCC. /Patient has a new lesion on left thigh that has been growing and painful since December. ).  Follow-up skin cancer, rash on face, painful growths on left leg Location:  Duration:  Quality:  Associated Signs/Symptoms: Modifying Factors:  Severity:  Timing: Context:   Objective  Well appearing patient in no apparent distress; mood and affect are within normal limits. Head - Anterior (Face), Left Ear, Right Ear Central facial erythema with fine scale   Torso - Posterior (Back) Multiple flattopped keratotic brown papules   Left Thigh - Anterior [place] like 9 mm nodule       Left Breast No sign residual skin cancer    All skin waist up examined.  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  Skin / nail biopsy Type of biopsy: tangential   Informed consent: discussed and consent obtained   Timeout: patient name, date of birth, surgical site, and procedure verified   Anesthesia: the lesion was anesthetized in a standard fashion   Anesthetic:  1% lidocaine w/ epinephrine 1-100,000 local infiltration Instrument used: flexible razor blade   Hemostasis achieved with: aluminum chloride and electrodesiccation   Outcome: patient tolerated procedure well   Post-procedure details: wound care instructions given    Destruction of lesion Complexity: simple   Destruction method: electrodesiccation and curettage   Informed consent: discussed and consent obtained   Timeout:  patient  name, date of birth, surgical site, and procedure verified Anesthesia: the lesion was anesthetized in a standard fashion   Anesthetic:  1% lidocaine w/ epinephrine 1-100,000 local infiltration Curettage performed in three different directions: Yes   Curettage cycles:  3 Lesion length (cm):  1.8 Lesion width (cm):  1.8 Margin per side (cm):  0 Final wound size (cm):  1.8 Hemostasis achieved with:  aluminum chloride Outcome: patient tolerated procedure well with no complications   Post-procedure details: wound care instructions given    Specimen 1 - Surgical pathology Differential Diagnosis: R/O BCC VS SCC TXPBX Check Margins: No  Shave biopsy showed obvious deeper keratin which was curetted and cauterized.  Personal history of skin cancer Left Breast  Check as needed change      I, Lavonna Monarch, MD, have reviewed all documentation for this visit.  The documentation on 10/07/21 for the exam, diagnosis, procedures, and orders are all accurate and complete.

## 2021-10-21 DIAGNOSIS — M62838 Other muscle spasm: Secondary | ICD-10-CM | POA: Diagnosis not present

## 2021-10-21 DIAGNOSIS — K5900 Constipation, unspecified: Secondary | ICD-10-CM | POA: Diagnosis not present

## 2021-10-21 DIAGNOSIS — R102 Pelvic and perineal pain: Secondary | ICD-10-CM | POA: Diagnosis not present

## 2021-10-21 DIAGNOSIS — M6281 Muscle weakness (generalized): Secondary | ICD-10-CM | POA: Diagnosis not present

## 2021-10-29 ENCOUNTER — Encounter (INDEPENDENT_AMBULATORY_CARE_PROVIDER_SITE_OTHER): Payer: Self-pay | Admitting: *Deleted

## 2021-10-29 ENCOUNTER — Telehealth (INDEPENDENT_AMBULATORY_CARE_PROVIDER_SITE_OTHER): Payer: Self-pay | Admitting: *Deleted

## 2021-10-29 MED ORDER — PEG 3350-KCL-NA BICARB-NACL 420 G PO SOLR: 4000.0000 mL | Freq: Once | ORAL | 0 refills | Status: AC

## 2021-10-29 NOTE — Telephone Encounter (Signed)
Patient needs trilyte 

## 2021-10-30 ENCOUNTER — Other Ambulatory Visit (INDEPENDENT_AMBULATORY_CARE_PROVIDER_SITE_OTHER): Payer: Self-pay

## 2021-10-30 DIAGNOSIS — Z800 Family history of malignant neoplasm of digestive organs: Secondary | ICD-10-CM

## 2021-11-19 ENCOUNTER — Telehealth (INDEPENDENT_AMBULATORY_CARE_PROVIDER_SITE_OTHER): Payer: Self-pay | Admitting: *Deleted

## 2021-11-19 DIAGNOSIS — M62838 Other muscle spasm: Secondary | ICD-10-CM | POA: Diagnosis not present

## 2021-11-19 DIAGNOSIS — K5900 Constipation, unspecified: Secondary | ICD-10-CM | POA: Diagnosis not present

## 2021-11-19 DIAGNOSIS — R102 Pelvic and perineal pain: Secondary | ICD-10-CM | POA: Diagnosis not present

## 2021-11-19 DIAGNOSIS — M6281 Muscle weakness (generalized): Secondary | ICD-10-CM | POA: Diagnosis not present

## 2021-11-19 NOTE — Telephone Encounter (Signed)
Referring MD/PCP: shah ? ?Procedure: tcs ? ?Reason/Indication:  fam hx colon ca ? ?Has patient had this procedure before?  Yes, 10/2016 ? If so, when, by whom and where?   ? ?Is there a family history of colon cancer?  Yes, father & grandmother ? Who?  What age when diagnosed?   ? ?Is patient diabetic? If yes, Type 1 or Type 2   no ?     ?Does patient have prosthetic heart valve or mechanical valve?  no ? ?Do you have a pacemaker/defibrillator?  no ? ?Has patient ever had endocarditis/atrial fibrillation? no ? ?Does patient use oxygen? no ? ?Has patient had joint replacement within last 12 months?  no ? ?Is patient constipated or do they take laxatives? no ? ?Does patient have a history of alcohol/drug use?  no ? ?Have you had a stroke/heart attack last 6 mths? no ? ?Do you take medicine for weight loss?  no ? ?For male patients,: have you had a hysterectomy  ?                     are you post menopausal  ?                     do you still have your menstrual cycle  ? ?Is patient on blood thinner such as Coumadin, Plavix and/or Aspirin? no ? ?Medications: ibuprofen ? ?Allergies:  ? ?Medication Adjustment per Dr Rehman/Dr Jenetta Downer  ? ?Procedure date & time: 12/17/21 ? ? ?

## 2021-12-09 NOTE — Patient Instructions (Signed)
? ? ? ? ? ? Blake Moss ? 12/09/2021  ?  ? '@PREFPERIOPPHARMACY'$ @ ? ? Your procedure is scheduled on  12/17/2021. ? ? Report to [facility] at  0730  A.M. ? ? Call this number if you have problems the morning of surgery: ? [phone] ? ? Remember: ? Follow the diet and prep instructions given to you by the office. ?  ? Take these medicines the morning of surgery with A SIP OF WATER   ? ?None ?  ? Do not wear jewelry, make-up or nail polish. ? Do not wear lotions, powders, or perfumes, or deodorant. ? Do not shave 48 hours prior to surgery.  Men may shave face and neck. ? Do not bring valuables to the hospital. ? [facility] is not responsible for any belongings or valuables. ? ?Contacts, dentures or bridgework may not be worn into surgery.  Leave your suitcase in the car.  After surgery it may be brought to your room. ? ?For patients admitted to the hospital, discharge time will be determined by your treatment team. ? ?Patients discharged the day of surgery will not be allowed to drive home and must have someone with them for 24 hours.  ? ? ?Special instructions:   DO NOT smoke tobacco or vape for 24 hours before your procedure. ? ?Please read over the following fact sheets that you were given. ?Anesthesia Post-op Instructions and Care and Recovery After Surgery ?  ? ? ? Colonoscopy, Adult, Care After ?This sheet gives you information about how to care for yourself after your procedure. Your health care provider may also give you more specific instructions. If you have problems or questions, contact your health care provider. ?What can I expect after the procedure? ?After the procedure, it is common to have: ?A small amount of blood in your stool for 24 hours after the procedure. ?Some gas. ?Mild cramping or bloating of your abdomen. ?Follow these instructions at home: ?Eating and drinking ? ?Drink enough fluid to keep your urine pale yellow. ?Follow instructions from your health care provider about eating or  drinking restrictions. ?Resume your normal diet as instructed by your health care provider. Avoid heavy or fried foods that are hard to digest. ?Activity ?Rest as told by your health care provider. ?Avoid sitting for a long time without moving. Get up to take short walks every 1-2 hours. This is important to improve blood flow and breathing. Ask for help if you feel weak or unsteady. ?Return to your normal activities as told by your health care provider. Ask your health care provider what activities are safe for you. ?Managing cramping and bloating ? ?Try walking around when you have cramps or feel bloated. ?Apply heat to your abdomen as told by your health care provider. Use the heat source that your health care provider recommends, such as a moist heat pack or a heating pad. ?Place a towel between your skin and the heat source. ?Leave the heat on for 20-30 minutes. ?Remove the heat if your skin turns bright red. This is especially important if you are unable to feel pain, heat, or cold. You may have a greater risk of getting burned. ?General instructions ?If you were given a sedative during the procedure, it can affect you for several hours. Do not drive or operate machinery until your health care provider says that it is safe. ?For the first 24 hours after the procedure: ?Do not sign important documents. ?Do not drink alcohol. ?Do your  regular daily activities at a slower pace than normal. ?Eat soft foods that are easy to digest. ?Take over-the-counter and prescription medicines only as told by your health care provider. ?Keep all follow-up visits as told by your health care provider. This is important. ?Contact a health care provider if: ?You have blood in your stool 2-3 days after the procedure. ?Get help right away if you have: ?More than a small spotting of blood in your stool. ?Large blood clots in your stool. ?Swelling of your abdomen. ?Nausea or vomiting. ?A fever. ?Increasing pain in your abdomen that is  not relieved with medicine. ?Summary ?After the procedure, it is common to have a small amount of blood in your stool. You may also have mild cramping and bloating of your abdomen. ?If you were given a sedative during the procedure, it can affect you for several hours. Do not drive or operate machinery until your health care provider says that it is safe. ?Get help right away if you have a lot of blood in your stool, nausea or vomiting, a fever, or increased pain in your abdomen. ?This information is not intended to replace advice given to you by your health care provider. Make sure you discuss any questions you have with your health care provider. ?Document Revised: 06/29/2019 Document Reviewed: 03/19/2019 ?Elsevier Patient Education ? [place]. ?Monitored Anesthesia Care, Care After ?This sheet gives you information about how to care for yourself after your procedure. Your health care provider may also give you more specific instructions. If you have problems or questions, contact your health care provider. ?What can I expect after the procedure? ?After the procedure, it is common to have: ?Tiredness. ?Forgetfulness about what happened after the procedure. ?Impaired judgment for important decisions. ?Nausea or vomiting. ?Some difficulty with balance. ?Follow these instructions at home: ?For the time period you were told by your health care provider: ?  ?Rest as needed. ?Do not participate in activities where you could fall or become injured. ?Do not drive or use machinery. ?Do not drink alcohol. ?Do not take sleeping pills or medicines that cause drowsiness. ?Do not make important decisions or sign legal documents. ?Do not take care of children on your own. ?Eating and drinking ?Follow the diet that is recommended by your health care provider. ?Drink enough fluid to keep your urine pale yellow. ?If you vomit: ?Drink water, juice, or soup when you can drink without vomiting. ?Make sure you have little or  no nausea before eating solid foods. ?General instructions ?Have a responsible adult stay with you for the time you are told. It is important to have someone help care for you until you are awake and alert. ?Take over-the-counter and prescription medicines only as told by your health care provider. ?If you have sleep apnea, surgery and certain medicines can increase your risk for breathing problems. Follow instructions from your health care provider about wearing your sleep device: ?Anytime you are sleeping, including during daytime naps. ?While taking prescription pain medicines, sleeping medicines, or medicines that make you drowsy. ?Avoid smoking. ?Keep all follow-up visits as told by your health care provider. This is important. ?Contact a health care provider if: ?You keep feeling nauseous or you keep vomiting. ?You feel light-headed. ?You are still sleepy or having trouble with balance after 24 hours. ?You develop a rash. ?You have a fever. ?You have redness or swelling around the IV site. ?Get help right away if: ?You have trouble breathing. ?You have new-onset confusion at home. ?  Summary ?For several hours after your procedure, you may feel tired. You may also be forgetful and have poor judgment. ?Have a responsible adult stay with you for the time you are told. It is important to have someone help care for you until you are awake and alert. ?Rest as told. Do not drive or operate machinery. Do not drink alcohol or take sleeping pills. ?Get help right away if you have trouble breathing, or if you suddenly become confused. ?This information is not intended to replace advice given to you by your health care provider. Make sure you discuss any questions you have with your health care provider. ?Document Revised: 05/08/2020 Document Reviewed: 07/26/2019 ?Elsevier Patient Education ? [place]. ? ?

## 2021-12-11 DIAGNOSIS — R300 Dysuria: Secondary | ICD-10-CM | POA: Diagnosis not present

## 2021-12-11 DIAGNOSIS — R350 Frequency of micturition: Secondary | ICD-10-CM | POA: Diagnosis not present

## 2021-12-11 DIAGNOSIS — N3001 Acute cystitis with hematuria: Secondary | ICD-10-CM | POA: Diagnosis not present

## 2021-12-14 ENCOUNTER — Encounter (HOSPITAL_COMMUNITY)
Admission: RE | Admit: 2021-12-14 | Discharge: 2021-12-14 | Disposition: A | Discharge status: Home / Self Care | Payer: Medicare Other | Source: Ambulatory Visit | Attending: Internal Medicine | Admitting: Internal Medicine

## 2021-12-14 NOTE — Pre-Procedure Instructions (Signed)
Attempted pre-op phone call. Left voivcmail for patient to call us back. ?

## 2021-12-15 ENCOUNTER — Ambulatory Visit (INDEPENDENT_AMBULATORY_CARE_PROVIDER_SITE_OTHER): Payer: Medicare Other | Admitting: Dermatology

## 2021-12-15 DIAGNOSIS — L820 Inflamed seborrheic keratosis: Secondary | ICD-10-CM

## 2021-12-15 DIAGNOSIS — D485 Neoplasm of uncertain behavior of skin: Secondary | ICD-10-CM

## 2021-12-15 DIAGNOSIS — C44729 Squamous cell carcinoma of skin of left lower limb, including hip: Secondary | ICD-10-CM | POA: Diagnosis not present

## 2021-12-15 DIAGNOSIS — L988 Other specified disorders of the skin and subcutaneous tissue: Secondary | ICD-10-CM | POA: Diagnosis not present

## 2021-12-15 DIAGNOSIS — L821 Other seborrheic keratosis: Secondary | ICD-10-CM

## 2021-12-15 DIAGNOSIS — K5900 Constipation, unspecified: Secondary | ICD-10-CM | POA: Diagnosis not present

## 2021-12-15 DIAGNOSIS — R102 Pelvic and perineal pain: Secondary | ICD-10-CM | POA: Diagnosis not present

## 2021-12-15 DIAGNOSIS — M6281 Muscle weakness (generalized): Secondary | ICD-10-CM | POA: Diagnosis not present

## 2021-12-15 DIAGNOSIS — M62838 Other muscle spasm: Secondary | ICD-10-CM | POA: Diagnosis not present

## 2021-12-15 NOTE — Patient Instructions (Signed)

## 2021-12-17 ENCOUNTER — Ambulatory Visit (HOSPITAL_BASED_OUTPATIENT_CLINIC_OR_DEPARTMENT_OTHER): Payer: Medicare Other | Admitting: Anesthesiology

## 2021-12-17 ENCOUNTER — Ambulatory Visit (HOSPITAL_COMMUNITY): Payer: Medicare Other | Admitting: Anesthesiology

## 2021-12-17 ENCOUNTER — Encounter (HOSPITAL_COMMUNITY): Payer: Self-pay | Admitting: Internal Medicine

## 2021-12-17 ENCOUNTER — Ambulatory Visit (HOSPITAL_COMMUNITY)
Admission: RE | Admit: 2021-12-17 | Discharge: 2021-12-17 | Disposition: A | Discharge status: Home / Self Care | Payer: Medicare Other | Attending: Internal Medicine | Admitting: Internal Medicine

## 2021-12-17 ENCOUNTER — Encounter (HOSPITAL_COMMUNITY)
Admission: RE | Disposition: A | Discharge status: Home / Self Care | Payer: Self-pay | Source: Home / Self Care | Attending: Internal Medicine

## 2021-12-17 ENCOUNTER — Other Ambulatory Visit: Payer: Self-pay

## 2021-12-17 DIAGNOSIS — Z800 Family history of malignant neoplasm of digestive organs: Secondary | ICD-10-CM

## 2021-12-17 DIAGNOSIS — K644 Residual hemorrhoidal skin tags: Secondary | ICD-10-CM | POA: Insufficient documentation

## 2021-12-17 DIAGNOSIS — K219 Gastro-esophageal reflux disease without esophagitis: Secondary | ICD-10-CM | POA: Insufficient documentation

## 2021-12-17 DIAGNOSIS — Z1211 Encounter for screening for malignant neoplasm of colon: Secondary | ICD-10-CM | POA: Insufficient documentation

## 2021-12-17 DIAGNOSIS — Z87891 Personal history of nicotine dependence: Secondary | ICD-10-CM | POA: Insufficient documentation

## 2021-12-17 DIAGNOSIS — K5730 Diverticulosis of large intestine without perforation or abscess without bleeding: Secondary | ICD-10-CM

## 2021-12-17 DIAGNOSIS — M1990 Unspecified osteoarthritis, unspecified site: Secondary | ICD-10-CM | POA: Diagnosis not present

## 2021-12-17 DIAGNOSIS — Z09 Encounter for follow-up examination after completed treatment for conditions other than malignant neoplasm: Secondary | ICD-10-CM | POA: Diagnosis not present

## 2021-12-17 DIAGNOSIS — Z8673 Personal history of transient ischemic attack (TIA), and cerebral infarction without residual deficits: Secondary | ICD-10-CM | POA: Diagnosis not present

## 2021-12-17 DIAGNOSIS — Z86010 Personal history of colonic polyps: Secondary | ICD-10-CM

## 2021-12-17 DIAGNOSIS — Z8546 Personal history of malignant neoplasm of prostate: Secondary | ICD-10-CM | POA: Diagnosis not present

## 2021-12-17 DIAGNOSIS — Z9079 Acquired absence of other genital organ(s): Secondary | ICD-10-CM | POA: Insufficient documentation

## 2021-12-17 HISTORY — PX: COLONOSCOPY WITH PROPOFOL: SHX5780

## 2021-12-17 SURGERY — COLONOSCOPY WITH PROPOFOL
Anesthesia: General

## 2021-12-17 MED ORDER — LACTATED RINGERS IV SOLN: INTRAVENOUS | Status: DC

## 2021-12-17 MED ORDER — PROPOFOL 500 MG/50ML IV EMUL
INTRAVENOUS | Status: DC | PRN
  Administered 2021-12-17: 150 ug/kg/min via INTRAVENOUS

## 2021-12-17 MED ORDER — PROPOFOL 10 MG/ML IV BOLUS
INTRAVENOUS | Status: DC | PRN
  Administered 2021-12-17: 100 mg via INTRAVENOUS

## 2021-12-17 MED ORDER — LIDOCAINE HCL (CARDIAC) PF 50 MG/5ML IV SOSY
PREFILLED_SYRINGE | INTRAVENOUS | Status: DC | PRN
  Administered 2021-12-17: 50 mg via INTRAVENOUS

## 2021-12-17 NOTE — H&P (Addendum)
Blake Moss is an 71 y.o. male.   ?Chief Complaint: Patient is here for colonoscopy ?HPI: Patient is 71 year old Caucasian male who has a history of colonic polyps and is here for surveillance colonoscopy.  He had polyps removed from his colon when he was in his 21s.  No polyps were discovered on colonoscopy of 20 2012 and 2018.  He denies abdominal pain change in bowel habits or rectal bleeding.  He is having problem with defecation because of pelvic floor dyssynergia which started after radical prostatectomy.  He is getting biofeedback therapy once a month via [facility] and it helps. ?Family history significant for colon carcinoma in his father who was 28 at time of diagnosis and died 1 year later of metastatic disease.  Maternal grandmother also had colon cancer in her 48s. ?Patient is on full dose aspirin which is on hold. ? ?Past Medical History:  ?Diagnosis Date  ? Asbestosis ([place])   ? Atypical nevus 03/19/2013  ? right mid back moderate no tx  ? Blind 09/2018  ? right eye  ? Blurry vision, right eye   ? Cancer [facility])   ? prostate cancer  ? Cervical disc disease   ? Depression 2002  ? Paxil helped  ? Diverticulitis   ? Diverticulitis, colon   ? Elevated prostate specific antigen (PSA)   ? GERD (gastroesophageal reflux disease)   ? H/O: gout   ? Palpitations   ? Pneumonia 1997  ? Polyp of intestine   ? Prostatic cancer ([place])   ? Pure hypercholesterolemia   ? Rash   ? Superficial nodular basal cell carcinoma (BCC) 04/07/2021  ? Left Mid Chest  ? TIA (transient ischemic attack)   ? ? ?Past Surgical History:  ?Procedure Laterality Date  ? COLONOSCOPY  07/23/2011  ? Procedure: COLONOSCOPY;  Surgeon: Rogene Houston, MD;  [facility];  Service: Endoscopy;  Laterality: N/A;  7:30  ? COLONOSCOPY N/A 10/28/2016  ? Procedure: COLONOSCOPY;  Surgeon: Rogene Houston, MD;  Location: [facility];  Service: Endoscopy;  Laterality: N/A;  100  ? EYE SURGERY Bilateral 2019  ? not done at same time,09/28/18  now blind in right eye  ? footl surgery  a  ? as a child  ? HERNIA REPAIR    ? left inguinal 4 yrs ago  ? LYMPHADENECTOMY Bilateral 11/24/2015  ? Procedure: PELVIC LYMPHADENECTOMY;  Surgeon: Raynelle Bring, MD;  Location: [facility];  Service: Urology;  Laterality: Bilateral;  ? PROSTATE BIOPSY    ? ROBOT ASSISTED LAPAROSCOPIC RADICAL PROSTATECTOMY N/A 11/24/2015  ? Procedure: XI ROBOTIC ASSISTED LAPAROSCOPIC RADICAL PROSTATECTOMY LEVEL 2;  Surgeon: Raynelle Bring, MD;  Location: [facility];  Service: Urology;  Laterality: N/A;  ? ? ?Family History  ?Problem Relation Age of Onset  ? Coronary artery disease Mother   ? Other Mother   ?     thinks mother died of stroke, not confirmed   ? Stroke Mother   ? Colon cancer Father   ? Lupus Father   ? Transient ischemic attack Brother   ?     multiple  ? Colon cancer Other   ? Stroke Paternal Uncle   ?     2 uncles  ? ?Social History:  reports that he quit smoking about 48 years ago. He has never used smokeless tobacco. He reports that he does not drink alcohol and does not use drugs. ? ?Allergies:  ?Allergies  ?Allergen Reactions  ? Lamisil [Terbinafine] Rash  ?  **  Antifungals**  ? Erythromycin Nausea Only  ? Levaquin [Levofloxacin]   ?  Fatigue, not hungry  ? Penicillins Rash  ?  Marland KitchenMarland KitchenHas patient had a PCN reaction causing immediate rash, facial/tongue/throat swelling, SOB or lightheadedness with hypotension: No ?Has patient had a PCN reaction causing severe rash involving mucus membranes or skin necrosis: No ?Has patient had a PCN reaction that required hospitalization No ?Has patient had a PCN reaction occurring within the last 10 years: No ?If all of the above answers are "NO", then may proceed with Cephalosporin use. ?  ? ? ?Medications Prior to Admission  ?Medication Sig Dispense Refill  ? aspirin EC 325 MG tablet Take 1 tablet (325 mg total) by mouth daily. 30 tablet 0  ? hydrocortisone 2.5 % ointment APPLY TO EARS AND FACE 30 g 0  ? hydrocortisone cream 1 % Apply 1 application.  topically daily as needed for itching.    ? ibuprofen (ADVIL) 200 MG tablet Take 400 mg by mouth every 6 (six) hours as needed for moderate pain or headache.    ? ? ?No results found for this or any previous visit (from the past 48 hour(s)). ?No results found. ? ?Review of Systems ? ?Blood pressure (!) 143/89, pulse 74, temperature 98.3 ?F (36.8 ?C), temperature source Oral, resp. rate 18, SpO2 98 %. ?Physical Exam ?HENT:  ?   Mouth/Throat:  ?   Mouth: Mucous membranes are moist.  ?   Pharynx: Oropharynx is clear.  ?Eyes:  ?   General: No scleral icterus. ?   Conjunctiva/sclera: Conjunctivae normal.  ?Cardiovascular:  ?   Rate and Rhythm: Normal rate and regular rhythm.  ?   Heart sounds: Normal heart sounds. No murmur heard. ?Pulmonary:  ?   Effort: Pulmonary effort is normal.  ?   Breath sounds: Normal breath sounds.  ?Abdominal:  ?   General: There is no distension.  ?   Palpations: Abdomen is soft. There is no mass.  ?   Tenderness: There is no abdominal tenderness.  ?Musculoskeletal:     ?   General: No swelling.  ?   Cervical back: Neck supple.  ?Lymphadenopathy:  ?   Cervical: No cervical adenopathy.  ?Skin: ?   General: Skin is warm and dry.  ?Neurological:  ?   Mental Status: He is alert.  ?  ? ?Assessment/Plan ? ?History of colonic polyps. ?Family history of colon carcinoma in first and second-degree relative(father and maternal grandmother who are both in their late 62s) ?Surveillance colonoscopy ? ?Hildred Laser, MD ?12/17/2021, 8:51 AM ? ? ? ?

## 2021-12-17 NOTE — Anesthesia Preprocedure Evaluation (Signed)
Anesthesia Evaluation  ?Patient identified by MRN, date of birth, ID band ?Patient awake ? ? ? ?Reviewed: ?Allergy & Precautions, NPO status , Patient's Chart, lab work & pertinent test results ? ?Airway ?Mallampati: I ? ?TM Distance: >3 FB ?Neck ROM: Full ? ? ?Comment: Cervical disc disease Dental ? ?(+) Dental Advisory Given, Missing ?  ?Pulmonary ?pneumonia, resolved, former smoker,  ?  ?Pulmonary exam normal ?breath sounds clear to auscultation ? ? ? ? ? ? Cardiovascular ?Exercise Tolerance: Good ?negative cardio ROS ?Normal cardiovascular exam ?Rhythm:Regular Rate:Normal ? ? ?  ?Neuro/Psych ?PSYCHIATRIC DISORDERS Depression TIA  ? GI/Hepatic ?Neg liver ROS, GERD  Controlled,  ?Endo/Other  ?negative endocrine ROS ? Renal/GU ?negative Renal ROS  ? ?Prostate cancer ? ?  ?Musculoskeletal ? ?(+) Arthritis  (gout),  ? Abdominal ?  ?Peds ?negative pediatric ROS ?(+)  Hematology ?negative hematology ROS ?(+)   ?Anesthesia Other Findings ? ? Reproductive/Obstetrics ?negative OB ROS ? ?  ? ? ? ? ? ? ? ? ? ? ? ? ? ?  ?  ? ? ? ? ? ? ?Anesthesia Physical ?Anesthesia Plan ? ?ASA: 2 ? ?Anesthesia Plan: General  ? ?Post-op Pain Management: Minimal or no pain anticipated  ? ?Induction: Intravenous ? ?PONV Risk Score and Plan: TIVA ? ?Airway Management Planned: Nasal Cannula and Natural Airway ? ?Additional Equipment:  ? ?Intra-op Plan:  ? ?Post-operative Plan:  ? ?Informed Consent: I have reviewed the patients History and Physical, chart, labs and discussed the procedure including the risks, benefits and alternatives for the proposed anesthesia with the patient or authorized representative who has indicated his/her understanding and acceptance.  ? ? ? ?Dental advisory given ? ?Plan Discussed with: CRNA and Surgeon ? ?Anesthesia Plan Comments:   ? ? ? ? ?Anesthesia Quick Evaluation ? ?

## 2021-12-17 NOTE — Anesthesia Postprocedure Evaluation (Signed)
Anesthesia Post Note ? ?Patient: Blake Moss ? ?Procedure(s) Performed: COLONOSCOPY WITH PROPOFOL ? ?Patient location during evaluation: Phase II ?Anesthesia Type: General ?Level of consciousness: awake and alert and oriented ?Pain management: pain level controlled ?Vital Signs Assessment: post-procedure vital signs reviewed and stable ?Respiratory status: spontaneous breathing, nonlabored ventilation and respiratory function stable ?Cardiovascular status: blood pressure returned to baseline and stable ?Postop Assessment: no apparent nausea or vomiting ?Anesthetic complications: no ? ? ?No notable events documented. ? ? ?Last Vitals:  ?Vitals:  ? 12/17/21 0806 12/17/21 0916  ?BP: (!) 143/89 97/64  ?Pulse: 74   ?Resp: 18 14  ?Temp: 36.8 ?C 37.1 ?C  ?SpO2: 98% 96%  ?  ?Last Pain:  ?Vitals:  ? 12/17/21 0916  ?TempSrc: Oral  ?PainSc: 0-No pain  ? ? ?  ?  ?  ?  ?  ?  ? ?Shareeka Yim C Gearald Stonebraker ? ? ? ? ?

## 2021-12-17 NOTE — Op Note (Signed)
[facility] ?Patient Name: Blake Moss ?Procedure Date: 12/17/2021 8:38 AM ?MRN: [id number] ?Date of Birth: 1951-06-19 ?Attending MD: Hildred Laser , MD ?CSN: [id number] ?Age: 71 ?Admit Type: Outpatient ?Procedure:                Colonoscopy ?Indications:              High risk colon cancer surveillance: Personal  ?                          history of colonic polyps ?Providers:                Hildred Laser, MD, Rosina Lowenstein, RN, Crisann  ?                          Wynonia Lawman, Technician ?Referring MD:             Monico Blitz, MD ?Medicines:                Propofol per Anesthesia ?Complications:            No immediate complications. ?Estimated Blood Loss:     Estimated blood loss: none. ?Procedure:                Pre-Anesthesia Assessment: ?                          - Prior to the procedure, a History and Physical  ?                          was performed, and patient medications and  ?                          allergies were reviewed. The patient's tolerance of  ?                          previous anesthesia was also reviewed. The risks  ?                          and benefits of the procedure and the sedation  ?                          options and risks were discussed with the patient.  ?                          All questions were answered, and informed consent  ?                          was obtained. Prior Anticoagulants: The patient has  ?                          taken no previous anticoagulant or antiplatelet  ?                          agents except for NSAID medication. ASA Grade  ?                          Assessment:  II - A patient with mild systemic  ?                          disease. After reviewing the risks and benefits,  ?                          the patient was deemed in satisfactory condition to  ?                          undergo the procedure. ?                          After obtaining informed consent, the colonoscope  ?                          was passed under direct vision. Throughout  the  ?                          procedure, the patient's blood pressure, pulse, and  ?                          oxygen saturations were monitored continuously. The  ?                          [id number]-HQ190L ([id number]) scope was introduced through  ?                          the anus and advanced to the the cecum, identified  ?                          by appendiceal orifice and ileocecal valve. The  ?                          colonoscopy was performed without difficulty. The  ?                          patient tolerated the procedure well. The quality  ?                          of the bowel preparation was good. The ileocecal  ?                          valve, appendiceal orifice, and rectum were  ?                          photographed. ?Scope In: 8:57:51 AM ?Scope Out: 9:11:10 AM ?Scope Withdrawal Time: 0 hours 9 minutes 22 seconds  ?Total Procedure Duration: 0 hours 13 minutes 19 seconds  ?Findings: ?     The perianal and digital rectal examinations were normal. . ?     Scattered diverticula were found in the sigmoid colon. ?     The exam was otherwise normal throughout the examined colon. ?     External hemorrhoids were found during retroflexion. The hemorrhoids  ?     were small. ?Impression:               -  Diverticulosis in the sigmoid colon. ?                          - External hemorrhoids. ?                          - No specimens collected. ?Moderate Sedation: ?     Per Anesthesia Care ?Recommendation:           - Patient has a contact number available for  ?                          emergencies. The signs and symptoms of potential  ?                          delayed complications were discussed with the  ?                          patient. Return to normal activities tomorrow.  ?                          Written discharge instructions were provided to the  ?                          patient. ?                          - High fiber diet today. ?                          - Continue present medications. ?                           - Repeat colonoscopy in 5 years for surveillance. ?Procedure Code(s):        --- Professional --- ?                          [id number], Colonoscopy, flexible; diagnostic, including  ?                          collection of specimen(s) by brushing or washing,  ?                          when performed (separate procedure) ?Diagnosis Code(s):        --- Professional --- ?                          Z86.010, Personal history of colonic polyps ?                          K64.4, Residual hemorrhoidal skin tags ?                          K57.30, Diverticulosis of large intestine without  ?                          perforation or abscess without bleeding ?CPT copyright  2019 [employer]. All rights reserved. ?The codes documented in this report are preliminary and upon coder review may  ?be revised to meet current compliance requirements. ?Hildred Laser, MD ?Hildred Laser, MD ?12/17/2021 9:18:28 AM ?This report has been signed electronically. ?Number of Addenda: 0 ?

## 2021-12-17 NOTE — Transfer of Care (Signed)
Immediate Anesthesia Transfer of Care Note ? ?Patient: Blake Moss ? ?Procedure(s) Performed: COLONOSCOPY WITH PROPOFOL ? ?Patient Location: Short Stay ? ?Anesthesia Type:General ? ?Level of Consciousness: awake and patient cooperative ? ?Airway & Oxygen Therapy: Patient Spontanous Breathing ? ?Post-op Assessment: Report given to RN and Post -op Vital signs reviewed and stable ? ?Post vital signs: Reviewed and stable ? ?Last Vitals:  ?Vitals Value Taken Time  ?BP 97/64 12/17/21 0916  ?Temp 37.1 ?C 12/17/21 0916  ?Pulse 83   ?Resp 14 12/17/21 0916  ?SpO2 96 % 12/17/21 0916  ? ? ?Last Pain:  ?Vitals:  ? 12/17/21 0916  ?TempSrc: Oral  ?PainSc: 0-No pain  ?   ? ?  ? ?Complications: No notable events documented. ?

## 2021-12-17 NOTE — Discharge Instructions (Addendum)
Resume usual medications as before. High-fiber diet. No driving for 24 hours. Next colonoscopy in 5 years.    

## 2021-12-24 ENCOUNTER — Encounter (HOSPITAL_COMMUNITY): Payer: Self-pay | Admitting: Internal Medicine

## 2022-01-02 ENCOUNTER — Encounter: Payer: Self-pay | Admitting: Dermatology

## 2022-01-02 NOTE — Progress Notes (Signed)
? ?  Follow-Up Visit ?  ?Subjective  ?Blake Moss is a 71 y.o. male who presents for the following: Follow-up (3 month follow up on left thigh. Previous CIS. Patient does have new lesion on left collar bone. History of non mole skin cancers. ). ? ?Check site of previous skin cancer plus new spots flying and collarbone ?Location:  ?Duration:  ?Quality:  ?Associated Signs/Symptoms: ?Modifying Factors:  ?Severity:  ?Timing: ?Context:  ? ?Objective  ?Well appearing patient in no apparent distress; mood and affect are within normal limits. ?Left Thigh - Anterior ?7 mm pink crust lateral to site of previous skin cancer. ? ? ? ? ? ? ?Left Thigh - medial ?Persistent crust at site of recent squamous cell carcinoma ? ? ? ? ? ? ?Left Breast ?4 mm tan textured papule ? ? ? ?All skin waist up examined.  Plus legs ? ? ?Assessment & Plan  ? ? ?Neoplasm of uncertain behavior of skin ?Left Thigh - Anterior ? ?Skin / nail biopsy ?Type of biopsy: tangential   ?Informed consent: discussed and consent obtained   ?Timeout: patient name, date of birth, surgical site, and procedure verified   ?Anesthesia: the lesion was anesthetized in a standard fashion   ?Anesthetic:  1% lidocaine w/ epinephrine 1-100,000 local infiltration ?Instrument used: flexible razor blade   ?Hemostasis achieved with: ferric subsulfate   ?Outcome: patient tolerated procedure well   ?Post-procedure details: wound care instructions given   ? ?Specimen 2 - Surgical pathology ?Differential Diagnosis: scc vs bcc ?[id number] ?Check Margins: No ? ?Squamous cell carcinoma of leg, left ?Left Thigh - medial ? ?Skin / nail biopsy ?Type of biopsy: tangential   ?Informed consent: discussed and consent obtained   ?Timeout: patient name, date of birth, surgical site, and procedure verified   ?Anesthesia: the lesion was anesthetized in a standard fashion   ?Anesthetic:  1% lidocaine w/ epinephrine 1-100,000 local infiltration ?Instrument used: flexible razor blade   ?Hemostasis  achieved with: ferric subsulfate   ?Outcome: patient tolerated procedure well   ?Post-procedure details: wound care instructions given   ? ?Destruction of lesion ?Complexity: simple   ?Destruction method: electrodesiccation and curettage   ?Informed consent: discussed and consent obtained   ?Timeout:  patient name, date of birth, surgical site, and procedure verified ?Anesthesia: the lesion was anesthetized in a standard fashion   ?Anesthetic:  1% lidocaine w/ epinephrine 1-100,000 local infiltration ?Curettage cycles:  3 ?Lesion length (cm):  1 ?Lesion width (cm):  1 ?Margin per side (cm):  0 ?Final wound size (cm):  1 ?Hemostasis achieved with:  ferric subsulfate ?Outcome: patient tolerated procedure well with no complications   ?Post-procedure details: sterile dressing applied and wound care instructions given   ?Dressing type: bandage and petrolatum   ? ?Specimen 1 - Surgical pathology ?Differential Diagnosis: scc vs bcc ? ?Check Margins: No ? ?After curettage plus cautery the base was saucerized and sent for pathology ? ?Seborrheic keratosis ?Left Breast ? ?Recheck as needed change ? ? ? ? ? ?I, Lavonna Monarch, MD, have reviewed all documentation for this visit.  The documentation on 01/02/22 for the exam, diagnosis, procedures, and orders are all accurate and complete. ?

## 2022-01-12 DIAGNOSIS — M6281 Muscle weakness (generalized): Secondary | ICD-10-CM | POA: Diagnosis not present

## 2022-01-12 DIAGNOSIS — R102 Pelvic and perineal pain: Secondary | ICD-10-CM | POA: Diagnosis not present

## 2022-01-12 DIAGNOSIS — M62838 Other muscle spasm: Secondary | ICD-10-CM | POA: Diagnosis not present

## 2022-01-12 DIAGNOSIS — K5900 Constipation, unspecified: Secondary | ICD-10-CM | POA: Diagnosis not present

## 2022-01-26 DIAGNOSIS — Z7189 Other specified counseling: Secondary | ICD-10-CM | POA: Diagnosis not present

## 2022-01-26 DIAGNOSIS — Z1339 Encounter for screening examination for other mental health and behavioral disorders: Secondary | ICD-10-CM | POA: Diagnosis not present

## 2022-01-26 DIAGNOSIS — Z0000 Encounter for general adult medical examination without abnormal findings: Secondary | ICD-10-CM | POA: Diagnosis not present

## 2022-01-26 DIAGNOSIS — Z299 Encounter for prophylactic measures, unspecified: Secondary | ICD-10-CM | POA: Diagnosis not present

## 2022-01-26 DIAGNOSIS — Z1331 Encounter for screening for depression: Secondary | ICD-10-CM | POA: Diagnosis not present

## 2022-01-26 DIAGNOSIS — Z125 Encounter for screening for malignant neoplasm of prostate: Secondary | ICD-10-CM | POA: Diagnosis not present

## 2022-01-26 DIAGNOSIS — E7800 Pure hypercholesterolemia, unspecified: Secondary | ICD-10-CM | POA: Diagnosis not present

## 2022-01-26 DIAGNOSIS — R5383 Other fatigue: Secondary | ICD-10-CM | POA: Diagnosis not present

## 2022-01-26 DIAGNOSIS — Z79899 Other long term (current) drug therapy: Secondary | ICD-10-CM | POA: Diagnosis not present

## 2022-01-26 DIAGNOSIS — Z6823 Body mass index (BMI) 23.0-23.9, adult: Secondary | ICD-10-CM | POA: Diagnosis not present

## 2022-02-10 DIAGNOSIS — M6281 Muscle weakness (generalized): Secondary | ICD-10-CM | POA: Diagnosis not present

## 2022-02-10 DIAGNOSIS — N393 Stress incontinence (female) (male): Secondary | ICD-10-CM | POA: Diagnosis not present

## 2022-02-10 DIAGNOSIS — K5900 Constipation, unspecified: Secondary | ICD-10-CM | POA: Diagnosis not present

## 2022-02-10 DIAGNOSIS — M62838 Other muscle spasm: Secondary | ICD-10-CM | POA: Diagnosis not present

## 2022-02-10 DIAGNOSIS — R102 Pelvic and perineal pain: Secondary | ICD-10-CM | POA: Diagnosis not present

## 2022-03-10 DIAGNOSIS — K5900 Constipation, unspecified: Secondary | ICD-10-CM | POA: Diagnosis not present

## 2022-03-10 DIAGNOSIS — C61 Malignant neoplasm of prostate: Secondary | ICD-10-CM | POA: Diagnosis not present

## 2022-03-10 DIAGNOSIS — M6281 Muscle weakness (generalized): Secondary | ICD-10-CM | POA: Diagnosis not present

## 2022-03-10 DIAGNOSIS — R102 Pelvic and perineal pain: Secondary | ICD-10-CM | POA: Diagnosis not present

## 2022-03-10 DIAGNOSIS — M62838 Other muscle spasm: Secondary | ICD-10-CM | POA: Diagnosis not present

## 2022-04-02 DIAGNOSIS — N393 Stress incontinence (female) (male): Secondary | ICD-10-CM | POA: Diagnosis not present

## 2022-04-02 DIAGNOSIS — C61 Malignant neoplasm of prostate: Secondary | ICD-10-CM | POA: Diagnosis not present

## 2022-04-07 ENCOUNTER — Ambulatory Visit: Payer: Medicare Other | Admitting: Dermatology

## 2022-04-07 DIAGNOSIS — M62838 Other muscle spasm: Secondary | ICD-10-CM | POA: Diagnosis not present

## 2022-04-07 DIAGNOSIS — R102 Pelvic and perineal pain: Secondary | ICD-10-CM | POA: Diagnosis not present

## 2022-04-07 DIAGNOSIS — M6281 Muscle weakness (generalized): Secondary | ICD-10-CM | POA: Diagnosis not present

## 2022-04-07 DIAGNOSIS — K5900 Constipation, unspecified: Secondary | ICD-10-CM | POA: Diagnosis not present

## 2022-04-14 DIAGNOSIS — H353114 Nonexudative age-related macular degeneration, right eye, advanced atrophic with subfoveal involvement: Secondary | ICD-10-CM | POA: Diagnosis not present

## 2022-04-14 DIAGNOSIS — H3582 Retinal ischemia: Secondary | ICD-10-CM | POA: Diagnosis not present

## 2022-04-14 DIAGNOSIS — H353121 Nonexudative age-related macular degeneration, left eye, early dry stage: Secondary | ICD-10-CM | POA: Diagnosis not present

## 2022-04-14 DIAGNOSIS — H31093 Other chorioretinal scars, bilateral: Secondary | ICD-10-CM | POA: Diagnosis not present

## 2022-04-20 DIAGNOSIS — K5900 Constipation, unspecified: Secondary | ICD-10-CM | POA: Diagnosis not present

## 2022-04-20 DIAGNOSIS — M62838 Other muscle spasm: Secondary | ICD-10-CM | POA: Diagnosis not present

## 2022-04-20 DIAGNOSIS — R102 Pelvic and perineal pain: Secondary | ICD-10-CM | POA: Diagnosis not present

## 2022-04-20 DIAGNOSIS — M6281 Muscle weakness (generalized): Secondary | ICD-10-CM | POA: Diagnosis not present

## 2022-05-13 DIAGNOSIS — M62838 Other muscle spasm: Secondary | ICD-10-CM | POA: Diagnosis not present

## 2022-05-13 DIAGNOSIS — K5900 Constipation, unspecified: Secondary | ICD-10-CM | POA: Diagnosis not present

## 2022-05-13 DIAGNOSIS — R102 Pelvic and perineal pain: Secondary | ICD-10-CM | POA: Diagnosis not present

## 2022-05-13 DIAGNOSIS — M6281 Muscle weakness (generalized): Secondary | ICD-10-CM | POA: Diagnosis not present

## 2022-06-04 DIAGNOSIS — M6281 Muscle weakness (generalized): Secondary | ICD-10-CM | POA: Diagnosis not present

## 2022-06-04 DIAGNOSIS — R102 Pelvic and perineal pain: Secondary | ICD-10-CM | POA: Diagnosis not present

## 2022-06-04 DIAGNOSIS — M62838 Other muscle spasm: Secondary | ICD-10-CM | POA: Diagnosis not present

## 2022-06-04 DIAGNOSIS — K5900 Constipation, unspecified: Secondary | ICD-10-CM | POA: Diagnosis not present

## 2022-06-09 DIAGNOSIS — M6281 Muscle weakness (generalized): Secondary | ICD-10-CM | POA: Diagnosis not present

## 2022-06-09 DIAGNOSIS — M62838 Other muscle spasm: Secondary | ICD-10-CM | POA: Diagnosis not present

## 2022-06-09 DIAGNOSIS — K5900 Constipation, unspecified: Secondary | ICD-10-CM | POA: Diagnosis not present

## 2022-06-09 DIAGNOSIS — N393 Stress incontinence (female) (male): Secondary | ICD-10-CM | POA: Diagnosis not present

## 2022-06-09 DIAGNOSIS — R102 Pelvic and perineal pain: Secondary | ICD-10-CM | POA: Diagnosis not present

## 2022-06-16 ENCOUNTER — Ambulatory Visit: Payer: Medicare Other | Admitting: Dermatology

## 2022-06-30 DIAGNOSIS — M62838 Other muscle spasm: Secondary | ICD-10-CM | POA: Diagnosis not present

## 2022-06-30 DIAGNOSIS — R102 Pelvic and perineal pain: Secondary | ICD-10-CM | POA: Diagnosis not present

## 2022-06-30 DIAGNOSIS — K5900 Constipation, unspecified: Secondary | ICD-10-CM | POA: Diagnosis not present

## 2022-06-30 DIAGNOSIS — M6281 Muscle weakness (generalized): Secondary | ICD-10-CM | POA: Diagnosis not present

## 2022-07-20 DIAGNOSIS — N393 Stress incontinence (female) (male): Secondary | ICD-10-CM | POA: Diagnosis not present

## 2022-07-20 DIAGNOSIS — R102 Pelvic and perineal pain: Secondary | ICD-10-CM | POA: Diagnosis not present

## 2022-07-20 DIAGNOSIS — K5900 Constipation, unspecified: Secondary | ICD-10-CM | POA: Diagnosis not present

## 2022-07-20 DIAGNOSIS — M6281 Muscle weakness (generalized): Secondary | ICD-10-CM | POA: Diagnosis not present

## 2022-07-20 DIAGNOSIS — M62838 Other muscle spasm: Secondary | ICD-10-CM | POA: Diagnosis not present

## 2022-08-11 DIAGNOSIS — M62838 Other muscle spasm: Secondary | ICD-10-CM | POA: Diagnosis not present

## 2022-08-11 DIAGNOSIS — R102 Pelvic and perineal pain: Secondary | ICD-10-CM | POA: Diagnosis not present

## 2022-08-11 DIAGNOSIS — K5900 Constipation, unspecified: Secondary | ICD-10-CM | POA: Diagnosis not present

## 2022-08-11 DIAGNOSIS — M6281 Muscle weakness (generalized): Secondary | ICD-10-CM | POA: Diagnosis not present

## 2022-08-25 DIAGNOSIS — E7800 Pure hypercholesterolemia, unspecified: Secondary | ICD-10-CM | POA: Diagnosis not present

## 2022-08-25 DIAGNOSIS — Z6823 Body mass index (BMI) 23.0-23.9, adult: Secondary | ICD-10-CM | POA: Diagnosis not present

## 2022-08-25 DIAGNOSIS — J069 Acute upper respiratory infection, unspecified: Secondary | ICD-10-CM | POA: Diagnosis not present

## 2022-08-25 DIAGNOSIS — R509 Fever, unspecified: Secondary | ICD-10-CM | POA: Diagnosis not present

## 2022-08-25 DIAGNOSIS — Z299 Encounter for prophylactic measures, unspecified: Secondary | ICD-10-CM | POA: Diagnosis not present

## 2022-08-25 DIAGNOSIS — R059 Cough, unspecified: Secondary | ICD-10-CM | POA: Diagnosis not present

## 2022-09-02 DIAGNOSIS — R102 Pelvic and perineal pain: Secondary | ICD-10-CM | POA: Diagnosis not present

## 2022-09-02 DIAGNOSIS — K5900 Constipation, unspecified: Secondary | ICD-10-CM | POA: Diagnosis not present

## 2022-09-02 DIAGNOSIS — M62838 Other muscle spasm: Secondary | ICD-10-CM | POA: Diagnosis not present

## 2022-09-02 DIAGNOSIS — M6281 Muscle weakness (generalized): Secondary | ICD-10-CM | POA: Diagnosis not present

## 2022-09-22 DIAGNOSIS — R102 Pelvic and perineal pain: Secondary | ICD-10-CM | POA: Diagnosis not present

## 2022-09-22 DIAGNOSIS — M6281 Muscle weakness (generalized): Secondary | ICD-10-CM | POA: Diagnosis not present

## 2022-09-22 DIAGNOSIS — N393 Stress incontinence (female) (male): Secondary | ICD-10-CM | POA: Diagnosis not present

## 2022-09-22 DIAGNOSIS — M62838 Other muscle spasm: Secondary | ICD-10-CM | POA: Diagnosis not present

## 2022-09-22 DIAGNOSIS — K5900 Constipation, unspecified: Secondary | ICD-10-CM | POA: Diagnosis not present

## 2022-10-18 DIAGNOSIS — R102 Pelvic and perineal pain: Secondary | ICD-10-CM | POA: Diagnosis not present

## 2022-10-18 DIAGNOSIS — M6281 Muscle weakness (generalized): Secondary | ICD-10-CM | POA: Diagnosis not present

## 2022-10-18 DIAGNOSIS — M62838 Other muscle spasm: Secondary | ICD-10-CM | POA: Diagnosis not present

## 2022-10-18 DIAGNOSIS — K5900 Constipation, unspecified: Secondary | ICD-10-CM | POA: Diagnosis not present

## 2022-10-20 DIAGNOSIS — H3582 Retinal ischemia: Secondary | ICD-10-CM | POA: Diagnosis not present

## 2022-10-20 DIAGNOSIS — H59813 Chorioretinal scars after surgery for detachment, bilateral: Secondary | ICD-10-CM | POA: Diagnosis not present

## 2022-10-20 DIAGNOSIS — H353113 Nonexudative age-related macular degeneration, right eye, advanced atrophic without subfoveal involvement: Secondary | ICD-10-CM | POA: Diagnosis not present

## 2022-10-20 DIAGNOSIS — H353121 Nonexudative age-related macular degeneration, left eye, early dry stage: Secondary | ICD-10-CM | POA: Diagnosis not present

## 2022-11-10 DIAGNOSIS — R102 Pelvic and perineal pain: Secondary | ICD-10-CM | POA: Diagnosis not present

## 2022-11-10 DIAGNOSIS — M62838 Other muscle spasm: Secondary | ICD-10-CM | POA: Diagnosis not present

## 2022-11-10 DIAGNOSIS — K5900 Constipation, unspecified: Secondary | ICD-10-CM | POA: Diagnosis not present

## 2022-11-10 DIAGNOSIS — M6281 Muscle weakness (generalized): Secondary | ICD-10-CM | POA: Diagnosis not present

## 2022-12-01 DIAGNOSIS — K5900 Constipation, unspecified: Secondary | ICD-10-CM | POA: Diagnosis not present

## 2022-12-01 DIAGNOSIS — M62838 Other muscle spasm: Secondary | ICD-10-CM | POA: Diagnosis not present

## 2022-12-01 DIAGNOSIS — M6281 Muscle weakness (generalized): Secondary | ICD-10-CM | POA: Diagnosis not present

## 2022-12-01 DIAGNOSIS — R102 Pelvic and perineal pain: Secondary | ICD-10-CM | POA: Diagnosis not present

## 2022-12-23 DIAGNOSIS — K5900 Constipation, unspecified: Secondary | ICD-10-CM | POA: Diagnosis not present

## 2022-12-23 DIAGNOSIS — M6281 Muscle weakness (generalized): Secondary | ICD-10-CM | POA: Diagnosis not present

## 2022-12-23 DIAGNOSIS — R102 Pelvic and perineal pain: Secondary | ICD-10-CM | POA: Diagnosis not present

## 2022-12-23 DIAGNOSIS — M62838 Other muscle spasm: Secondary | ICD-10-CM | POA: Diagnosis not present

## 2023-01-13 DIAGNOSIS — R102 Pelvic and perineal pain: Secondary | ICD-10-CM | POA: Diagnosis not present

## 2023-01-13 DIAGNOSIS — K5900 Constipation, unspecified: Secondary | ICD-10-CM | POA: Diagnosis not present

## 2023-01-13 DIAGNOSIS — M62838 Other muscle spasm: Secondary | ICD-10-CM | POA: Diagnosis not present

## 2023-01-13 DIAGNOSIS — M6281 Muscle weakness (generalized): Secondary | ICD-10-CM | POA: Diagnosis not present

## 2023-01-28 DIAGNOSIS — E785 Hyperlipidemia, unspecified: Secondary | ICD-10-CM | POA: Diagnosis not present

## 2023-01-28 DIAGNOSIS — Z299 Encounter for prophylactic measures, unspecified: Secondary | ICD-10-CM | POA: Diagnosis not present

## 2023-01-28 DIAGNOSIS — R5383 Other fatigue: Secondary | ICD-10-CM | POA: Diagnosis not present

## 2023-01-28 DIAGNOSIS — Z1331 Encounter for screening for depression: Secondary | ICD-10-CM | POA: Diagnosis not present

## 2023-01-28 DIAGNOSIS — Z7189 Other specified counseling: Secondary | ICD-10-CM | POA: Diagnosis not present

## 2023-01-28 DIAGNOSIS — Z125 Encounter for screening for malignant neoplasm of prostate: Secondary | ICD-10-CM | POA: Diagnosis not present

## 2023-01-28 DIAGNOSIS — Z79899 Other long term (current) drug therapy: Secondary | ICD-10-CM | POA: Diagnosis not present

## 2023-01-28 DIAGNOSIS — Z0000 Encounter for general adult medical examination without abnormal findings: Secondary | ICD-10-CM | POA: Diagnosis not present

## 2023-01-28 DIAGNOSIS — Z1339 Encounter for screening examination for other mental health and behavioral disorders: Secondary | ICD-10-CM | POA: Diagnosis not present

## 2023-02-02 DIAGNOSIS — M6281 Muscle weakness (generalized): Secondary | ICD-10-CM | POA: Diagnosis not present

## 2023-02-02 DIAGNOSIS — M62838 Other muscle spasm: Secondary | ICD-10-CM | POA: Diagnosis not present

## 2023-02-02 DIAGNOSIS — N393 Stress incontinence (female) (male): Secondary | ICD-10-CM | POA: Diagnosis not present

## 2023-02-02 DIAGNOSIS — R102 Pelvic and perineal pain: Secondary | ICD-10-CM | POA: Diagnosis not present

## 2023-02-02 DIAGNOSIS — K5900 Constipation, unspecified: Secondary | ICD-10-CM | POA: Diagnosis not present

## 2023-02-08 DIAGNOSIS — I700 Atherosclerosis of aorta: Secondary | ICD-10-CM | POA: Diagnosis not present

## 2023-02-08 DIAGNOSIS — R051 Acute cough: Secondary | ICD-10-CM | POA: Diagnosis not present

## 2023-03-02 DIAGNOSIS — M62838 Other muscle spasm: Secondary | ICD-10-CM | POA: Diagnosis not present

## 2023-03-02 DIAGNOSIS — R102 Pelvic and perineal pain: Secondary | ICD-10-CM | POA: Diagnosis not present

## 2023-03-02 DIAGNOSIS — M6281 Muscle weakness (generalized): Secondary | ICD-10-CM | POA: Diagnosis not present

## 2023-03-02 DIAGNOSIS — K5900 Constipation, unspecified: Secondary | ICD-10-CM | POA: Diagnosis not present

## 2023-03-23 DIAGNOSIS — I340 Nonrheumatic mitral (valve) insufficiency: Secondary | ICD-10-CM | POA: Diagnosis not present

## 2023-03-23 DIAGNOSIS — Z299 Encounter for prophylactic measures, unspecified: Secondary | ICD-10-CM | POA: Diagnosis not present

## 2023-03-23 DIAGNOSIS — R079 Chest pain, unspecified: Secondary | ICD-10-CM | POA: Diagnosis not present

## 2023-03-23 DIAGNOSIS — I700 Atherosclerosis of aorta: Secondary | ICD-10-CM | POA: Diagnosis not present

## 2023-03-24 DIAGNOSIS — R102 Pelvic and perineal pain: Secondary | ICD-10-CM | POA: Diagnosis not present

## 2023-03-24 DIAGNOSIS — M62838 Other muscle spasm: Secondary | ICD-10-CM | POA: Diagnosis not present

## 2023-03-24 DIAGNOSIS — K5900 Constipation, unspecified: Secondary | ICD-10-CM | POA: Diagnosis not present

## 2023-03-24 DIAGNOSIS — C61 Malignant neoplasm of prostate: Secondary | ICD-10-CM | POA: Diagnosis not present

## 2023-03-24 DIAGNOSIS — M6281 Muscle weakness (generalized): Secondary | ICD-10-CM | POA: Diagnosis not present

## 2023-04-01 DIAGNOSIS — R102 Pelvic and perineal pain: Secondary | ICD-10-CM | POA: Diagnosis not present

## 2023-04-01 DIAGNOSIS — N393 Stress incontinence (female) (male): Secondary | ICD-10-CM | POA: Diagnosis not present

## 2023-04-01 DIAGNOSIS — C61 Malignant neoplasm of prostate: Secondary | ICD-10-CM | POA: Diagnosis not present

## 2023-04-01 DIAGNOSIS — R310 Gross hematuria: Secondary | ICD-10-CM | POA: Diagnosis not present

## 2023-04-05 DIAGNOSIS — I340 Nonrheumatic mitral (valve) insufficiency: Secondary | ICD-10-CM | POA: Diagnosis not present

## 2023-04-05 DIAGNOSIS — R079 Chest pain, unspecified: Secondary | ICD-10-CM | POA: Diagnosis not present

## 2023-04-05 DIAGNOSIS — I959 Hypotension, unspecified: Secondary | ICD-10-CM | POA: Diagnosis not present

## 2023-04-05 DIAGNOSIS — I77810 Thoracic aortic ectasia: Secondary | ICD-10-CM | POA: Diagnosis not present

## 2023-04-05 DIAGNOSIS — R5383 Other fatigue: Secondary | ICD-10-CM | POA: Diagnosis not present

## 2023-04-13 DIAGNOSIS — Z299 Encounter for prophylactic measures, unspecified: Secondary | ICD-10-CM | POA: Diagnosis not present

## 2023-04-13 DIAGNOSIS — I77810 Thoracic aortic ectasia: Secondary | ICD-10-CM | POA: Diagnosis not present

## 2023-04-13 DIAGNOSIS — R079 Chest pain, unspecified: Secondary | ICD-10-CM | POA: Diagnosis not present

## 2023-04-26 DIAGNOSIS — K7689 Other specified diseases of liver: Secondary | ICD-10-CM | POA: Diagnosis not present

## 2023-04-26 DIAGNOSIS — R310 Gross hematuria: Secondary | ICD-10-CM | POA: Diagnosis not present

## 2023-04-27 DIAGNOSIS — I712 Thoracic aortic aneurysm, without rupture, unspecified: Secondary | ICD-10-CM | POA: Diagnosis not present

## 2023-04-27 DIAGNOSIS — I10 Essential (primary) hypertension: Secondary | ICD-10-CM | POA: Diagnosis not present

## 2023-04-27 DIAGNOSIS — R002 Palpitations: Secondary | ICD-10-CM | POA: Diagnosis not present

## 2023-04-27 DIAGNOSIS — Z6821 Body mass index (BMI) 21.0-21.9, adult: Secondary | ICD-10-CM | POA: Diagnosis not present

## 2023-04-27 DIAGNOSIS — R102 Pelvic and perineal pain: Secondary | ICD-10-CM | POA: Diagnosis not present

## 2023-04-27 DIAGNOSIS — R0789 Other chest pain: Secondary | ICD-10-CM | POA: Diagnosis not present

## 2023-04-27 DIAGNOSIS — M6281 Muscle weakness (generalized): Secondary | ICD-10-CM | POA: Diagnosis not present

## 2023-04-27 DIAGNOSIS — E782 Mixed hyperlipidemia: Secondary | ICD-10-CM | POA: Diagnosis not present

## 2023-04-27 DIAGNOSIS — M62838 Other muscle spasm: Secondary | ICD-10-CM | POA: Diagnosis not present

## 2023-04-27 DIAGNOSIS — K5900 Constipation, unspecified: Secondary | ICD-10-CM | POA: Diagnosis not present

## 2023-05-04 DIAGNOSIS — H3582 Retinal ischemia: Secondary | ICD-10-CM | POA: Diagnosis not present

## 2023-05-04 DIAGNOSIS — H59813 Chorioretinal scars after surgery for detachment, bilateral: Secondary | ICD-10-CM | POA: Diagnosis not present

## 2023-05-04 DIAGNOSIS — H35372 Puckering of macula, left eye: Secondary | ICD-10-CM | POA: Diagnosis not present

## 2023-05-18 DIAGNOSIS — R102 Pelvic and perineal pain: Secondary | ICD-10-CM | POA: Diagnosis not present

## 2023-05-18 DIAGNOSIS — M6281 Muscle weakness (generalized): Secondary | ICD-10-CM | POA: Diagnosis not present

## 2023-05-18 DIAGNOSIS — M62838 Other muscle spasm: Secondary | ICD-10-CM | POA: Diagnosis not present

## 2023-05-18 DIAGNOSIS — K5900 Constipation, unspecified: Secondary | ICD-10-CM | POA: Diagnosis not present

## 2023-05-19 DIAGNOSIS — R002 Palpitations: Secondary | ICD-10-CM | POA: Diagnosis not present

## 2023-05-23 DIAGNOSIS — R002 Palpitations: Secondary | ICD-10-CM | POA: Diagnosis not present

## 2023-06-06 DIAGNOSIS — Z6821 Body mass index (BMI) 21.0-21.9, adult: Secondary | ICD-10-CM | POA: Diagnosis not present

## 2023-06-06 DIAGNOSIS — E782 Mixed hyperlipidemia: Secondary | ICD-10-CM | POA: Diagnosis not present

## 2023-06-06 DIAGNOSIS — R0789 Other chest pain: Secondary | ICD-10-CM | POA: Diagnosis not present

## 2023-06-06 DIAGNOSIS — R002 Palpitations: Secondary | ICD-10-CM | POA: Diagnosis not present

## 2023-06-06 DIAGNOSIS — I712 Thoracic aortic aneurysm, without rupture, unspecified: Secondary | ICD-10-CM | POA: Diagnosis not present

## 2023-06-06 DIAGNOSIS — I10 Essential (primary) hypertension: Secondary | ICD-10-CM | POA: Diagnosis not present

## 2023-06-09 DIAGNOSIS — I480 Paroxysmal atrial fibrillation: Secondary | ICD-10-CM | POA: Diagnosis not present

## 2023-06-09 DIAGNOSIS — R102 Pelvic and perineal pain: Secondary | ICD-10-CM | POA: Diagnosis not present

## 2023-06-09 DIAGNOSIS — Z23 Encounter for immunization: Secondary | ICD-10-CM | POA: Diagnosis not present

## 2023-06-09 DIAGNOSIS — Z299 Encounter for prophylactic measures, unspecified: Secondary | ICD-10-CM | POA: Diagnosis not present

## 2023-06-09 DIAGNOSIS — I10 Essential (primary) hypertension: Secondary | ICD-10-CM | POA: Diagnosis not present

## 2023-06-09 DIAGNOSIS — I2510 Atherosclerotic heart disease of native coronary artery without angina pectoris: Secondary | ICD-10-CM | POA: Diagnosis not present

## 2023-06-16 DIAGNOSIS — K5900 Constipation, unspecified: Secondary | ICD-10-CM | POA: Diagnosis not present

## 2023-06-16 DIAGNOSIS — M6281 Muscle weakness (generalized): Secondary | ICD-10-CM | POA: Diagnosis not present

## 2023-06-16 DIAGNOSIS — M62838 Other muscle spasm: Secondary | ICD-10-CM | POA: Diagnosis not present

## 2023-06-16 DIAGNOSIS — R102 Pelvic and perineal pain: Secondary | ICD-10-CM | POA: Diagnosis not present

## 2023-07-13 DIAGNOSIS — R102 Pelvic and perineal pain: Secondary | ICD-10-CM | POA: Diagnosis not present

## 2023-07-13 DIAGNOSIS — K5900 Constipation, unspecified: Secondary | ICD-10-CM | POA: Diagnosis not present

## 2023-07-13 DIAGNOSIS — M62838 Other muscle spasm: Secondary | ICD-10-CM | POA: Diagnosis not present

## 2023-07-13 DIAGNOSIS — M6281 Muscle weakness (generalized): Secondary | ICD-10-CM | POA: Diagnosis not present

## 2023-08-10 DIAGNOSIS — M62838 Other muscle spasm: Secondary | ICD-10-CM | POA: Diagnosis not present

## 2023-08-10 DIAGNOSIS — R102 Pelvic and perineal pain: Secondary | ICD-10-CM | POA: Diagnosis not present

## 2023-08-10 DIAGNOSIS — M6281 Muscle weakness (generalized): Secondary | ICD-10-CM | POA: Diagnosis not present

## 2023-08-10 DIAGNOSIS — K5900 Constipation, unspecified: Secondary | ICD-10-CM | POA: Diagnosis not present

## 2023-08-12 DIAGNOSIS — M62838 Other muscle spasm: Secondary | ICD-10-CM | POA: Diagnosis not present

## 2023-08-12 DIAGNOSIS — I480 Paroxysmal atrial fibrillation: Secondary | ICD-10-CM | POA: Diagnosis not present

## 2023-08-12 DIAGNOSIS — I10 Essential (primary) hypertension: Secondary | ICD-10-CM | POA: Diagnosis not present

## 2023-08-12 DIAGNOSIS — Z299 Encounter for prophylactic measures, unspecified: Secondary | ICD-10-CM | POA: Diagnosis not present

## 2023-08-12 DIAGNOSIS — G720 Drug-induced myopathy: Secondary | ICD-10-CM | POA: Diagnosis not present

## 2023-08-18 DIAGNOSIS — K5900 Constipation, unspecified: Secondary | ICD-10-CM | POA: Diagnosis not present

## 2023-08-18 DIAGNOSIS — R102 Pelvic and perineal pain: Secondary | ICD-10-CM | POA: Diagnosis not present

## 2023-08-18 DIAGNOSIS — M6281 Muscle weakness (generalized): Secondary | ICD-10-CM | POA: Diagnosis not present

## 2023-08-18 DIAGNOSIS — M62838 Other muscle spasm: Secondary | ICD-10-CM | POA: Diagnosis not present

## 2023-08-18 DIAGNOSIS — N393 Stress incontinence (female) (male): Secondary | ICD-10-CM | POA: Diagnosis not present

## 2023-09-08 DIAGNOSIS — K5900 Constipation, unspecified: Secondary | ICD-10-CM | POA: Diagnosis not present

## 2023-09-08 DIAGNOSIS — M6281 Muscle weakness (generalized): Secondary | ICD-10-CM | POA: Diagnosis not present

## 2023-09-08 DIAGNOSIS — M62838 Other muscle spasm: Secondary | ICD-10-CM | POA: Diagnosis not present

## 2023-09-08 DIAGNOSIS — R102 Pelvic and perineal pain: Secondary | ICD-10-CM | POA: Diagnosis not present

## 2023-09-21 DIAGNOSIS — M6281 Muscle weakness (generalized): Secondary | ICD-10-CM | POA: Diagnosis not present

## 2023-09-21 DIAGNOSIS — R102 Pelvic and perineal pain: Secondary | ICD-10-CM | POA: Diagnosis not present

## 2023-09-21 DIAGNOSIS — N393 Stress incontinence (female) (male): Secondary | ICD-10-CM | POA: Diagnosis not present

## 2023-09-21 DIAGNOSIS — K5900 Constipation, unspecified: Secondary | ICD-10-CM | POA: Diagnosis not present

## 2023-09-21 DIAGNOSIS — M62838 Other muscle spasm: Secondary | ICD-10-CM | POA: Diagnosis not present

## 2023-10-05 DIAGNOSIS — K5792 Diverticulitis of intestine, part unspecified, without perforation or abscess without bleeding: Secondary | ICD-10-CM | POA: Diagnosis not present

## 2023-10-05 DIAGNOSIS — R52 Pain, unspecified: Secondary | ICD-10-CM | POA: Diagnosis not present

## 2023-10-05 DIAGNOSIS — R102 Pelvic and perineal pain: Secondary | ICD-10-CM | POA: Diagnosis not present

## 2023-10-05 DIAGNOSIS — I480 Paroxysmal atrial fibrillation: Secondary | ICD-10-CM | POA: Diagnosis not present

## 2023-10-05 DIAGNOSIS — Z299 Encounter for prophylactic measures, unspecified: Secondary | ICD-10-CM | POA: Diagnosis not present

## 2023-10-06 DIAGNOSIS — K5900 Constipation, unspecified: Secondary | ICD-10-CM | POA: Diagnosis not present

## 2023-10-06 DIAGNOSIS — N393 Stress incontinence (female) (male): Secondary | ICD-10-CM | POA: Diagnosis not present

## 2023-10-06 DIAGNOSIS — R102 Pelvic and perineal pain: Secondary | ICD-10-CM | POA: Diagnosis not present

## 2023-10-06 DIAGNOSIS — M62838 Other muscle spasm: Secondary | ICD-10-CM | POA: Diagnosis not present

## 2023-10-06 DIAGNOSIS — M6281 Muscle weakness (generalized): Secondary | ICD-10-CM | POA: Diagnosis not present

## 2023-10-13 DIAGNOSIS — R102 Pelvic and perineal pain: Secondary | ICD-10-CM | POA: Diagnosis not present

## 2023-10-13 DIAGNOSIS — N393 Stress incontinence (female) (male): Secondary | ICD-10-CM | POA: Diagnosis not present

## 2023-10-13 DIAGNOSIS — M62838 Other muscle spasm: Secondary | ICD-10-CM | POA: Diagnosis not present

## 2023-10-13 DIAGNOSIS — M6281 Muscle weakness (generalized): Secondary | ICD-10-CM | POA: Diagnosis not present

## 2023-10-13 DIAGNOSIS — K5900 Constipation, unspecified: Secondary | ICD-10-CM | POA: Diagnosis not present

## 2023-10-20 NOTE — Progress Notes (Signed)
Updated mediations

## 2023-10-24 ENCOUNTER — Encounter: Payer: Self-pay | Admitting: Internal Medicine

## 2023-10-24 ENCOUNTER — Ambulatory Visit: Payer: Medicare Other | Attending: Internal Medicine | Admitting: Internal Medicine

## 2023-10-24 VITALS — BP 128/80 | HR 78 | Ht 71.0 in | Wt 157.2 lb

## 2023-10-24 DIAGNOSIS — E785 Hyperlipidemia, unspecified: Secondary | ICD-10-CM | POA: Insufficient documentation

## 2023-10-24 DIAGNOSIS — I4891 Unspecified atrial fibrillation: Secondary | ICD-10-CM | POA: Diagnosis not present

## 2023-10-24 DIAGNOSIS — Z8673 Personal history of transient ischemic attack (TIA), and cerebral infarction without residual deficits: Secondary | ICD-10-CM | POA: Diagnosis not present

## 2023-10-24 DIAGNOSIS — R5383 Other fatigue: Secondary | ICD-10-CM | POA: Diagnosis not present

## 2023-10-24 DIAGNOSIS — R0789 Other chest pain: Secondary | ICD-10-CM | POA: Insufficient documentation

## 2023-10-24 DIAGNOSIS — E7849 Other hyperlipidemia: Secondary | ICD-10-CM | POA: Insufficient documentation

## 2023-10-24 NOTE — Progress Notes (Signed)
Cardiology Office Note  Date: 10/24/2023   ID: Ayinde, Swim 10-Oct-1950, MRN [id number]  PCP:  Kirstie Peri, MD  Cardiologist:  Marjo Bicker, MD Electrophysiologist:  None   History of Present Illness: Blake Moss is a 73 y.o. male known to have history of TIA was referred to cardiology clinic for evaluation of atrial fibrillation.  [place].  He hiked last summer at 74F and had sudden onset of severe chest tightness and dizziness.  His blood pressure at that time was 90 mmHg SBP.  He is not on any antihypertensive medications.  He was seen by his PCP and underwent echocardiogram, exercise Myoview which were within normal limits.  He wore event monitor that showed atrial fibrillation and he was started on amiodarone but did not tolerate and had to stop it.  He was also placed on Eliquis but had to stop it as well.  His body did not tolerate.  I do not have event monitor report to review.  I reviewed the echocardiogram and exercise Myoview reports.  His baseline heart rate was 80 bpm that increased to 151 bpm with exercise consistent with chronotropic competence.  Although he does endorse fatigue since summer.  He said something is wrong with his body and he cannot pinpoint.  Past Medical History:  Diagnosis Date   A-fib (HCC)    Asbestosis (HCC)    Atypical nevus 03/19/2013   right mid back moderate no tx   Blind 09/2018   right eye   Blurry vision, right eye    Cancer [facility])    prostate cancer   Cervical disc disease    Depression 2002   Paxil helped   Diverticulitis    Diverticulitis, colon    Elevated prostate specific antigen (PSA)    GERD (gastroesophageal reflux disease)    H/O: gout    Palpitations    Pneumonia 1997   Polyp of intestine    Prostatic cancer (HCC)    Pure hypercholesterolemia    Rash    Superficial nodular basal cell carcinoma (BCC) 04/07/2021   Left Mid Chest   TIA (transient ischemic  attack)     Past Surgical History:  Procedure Laterality Date   COLONOSCOPY  07/23/2011   Procedure: COLONOSCOPY;  Surgeon: Malissa Hippo, MD;  [facility];  Service: Endoscopy;  Laterality: N/A;  7:30   COLONOSCOPY N/A 10/28/2016   Procedure: COLONOSCOPY;  Surgeon: Malissa Hippo, MD;  Location: [facility];  Service: Endoscopy;  Laterality: N/A;  100   COLONOSCOPY WITH PROPOFOL N/A 12/17/2021   Procedure: COLONOSCOPY WITH PROPOFOL;  Surgeon: Malissa Hippo, MD;  Location: [facility];  Service: Endoscopy;  Laterality: N/A;  910   EYE SURGERY Bilateral 2019   not done at same time,09/28/18 now blind in right eye   footl surgery  a   as a child   HERNIA REPAIR     left inguinal 4 yrs ago   LYMPHADENECTOMY Bilateral 11/24/2015   Procedure: PELVIC LYMPHADENECTOMY;  Surgeon: Heloise Purpura, MD;  Location: [facility];  Service: Urology;  Laterality: Bilateral;   PROSTATE BIOPSY     ROBOT ASSISTED LAPAROSCOPIC RADICAL PROSTATECTOMY N/A 11/24/2015   Procedure: XI ROBOTIC ASSISTED LAPAROSCOPIC RADICAL PROSTATECTOMY LEVEL 2;  Surgeon: Heloise Purpura, MD;  Location: [facility];  Service: Urology;  Laterality: N/A;    Current Outpatient Medications  Medication Sig Dispense Refill   hydrocortisone 2.5 %  ointment APPLY TO EARS AND FACE 30 g 0   hydrocortisone cream 1 % Apply 1 application. topically daily as needed for itching.     ibuprofen (ADVIL) 200 MG tablet Take 400 mg by mouth every 6 (six) hours as needed for moderate pain or headache.     rosuvastatin (CRESTOR) 5 MG tablet Take 5 mg by mouth once a week.     traZODone (DESYREL) 50 MG tablet Take 50 mg by mouth at bedtime.     No current facility-administered medications for this visit.   Allergies:  Terbinafine, Erythromycin, Levofloxacin, and Penicillins   Social History: The patient  reports that he quit smoking about 50 years ago. His smoking use included cigarettes. He has never used smokeless tobacco. He reports that he  does not drink alcohol and does not use drugs.   Family History: The patient's family history includes Colon cancer in his father and another family member; Coronary artery disease in his mother; Lupus in his father; Other in his mother; Stroke in his mother and paternal uncle; Transient ischemic attack in his brother.   ROS:  Please see the history of present illness. Otherwise, complete review of systems is positive for none  All other systems are reviewed and negative.   Physical Exam: VS:  BP 128/80   Pulse 78   Ht 5\' 11"  (1.803 m)   Wt 157 lb 3.2 oz (71.3 kg)   SpO2 96%   BMI 21.92 kg/m , BMI Body mass index is 21.92 kg/m.  Wt Readings from Last 3 Encounters:  10/24/23 157 lb 3.2 oz (71.3 kg)  12/15/21 160 lb (72.6 kg)  05/23/19 162 lb 3.2 oz (73.6 kg)    General: Patient appears comfortable at rest. HEENT: Conjunctiva and lids normal, oropharynx clear with moist mucosa. Neck: Supple, no elevated JVP or carotid bruits, no thyromegaly. Lungs: Clear to auscultation, nonlabored breathing at rest. Cardiac: Regular rate and rhythm, no S3 or significant systolic murmur, no pericardial rub. Abdomen: Soft, nontender, no hepatomegaly, bowel sounds present, no guarding or rebound. Extremities: No pitting edema, distal pulses 2+. Skin: Warm and dry. Musculoskeletal: No kyphosis. Neuropsychiatric: Alert and oriented x3, affect grossly appropriate.  Recent Labwork: No results found for requested labs within last 365 days.     Component Value Date/Time   CHOL 210 (H) 12/19/2017 0842   TRIG 117 12/19/2017 0842   HDL 51 12/19/2017 0842   CHOLHDL 4.1 12/19/2017 0842   LDLCALC 136 (H) 12/19/2017 0842     Assessment and Plan:  Self-reported history of atrial fibrillation: I reviewed the EKGs in the EMR that showed normal sinus rhythm with PACs.  He wore event monitor in 2024, I do not have the report to review.  Will obtain the event monitor report from his prior cardiologist in  [place], Dr Catha Gosselin.  He was previously on amiodarone and Eliquis that had been discontinued.  He denies having any palpitations as of now.  He is worried about starting any rate controlling agents that might drop his blood pressure and might lose his vision in the other eye.  1 episode of chest tightness last summer: Echocardiogram and exercise Myoview within normal limits.  Fatigue since last summer: I reviewed his excess Myoview that showed resting HR 80 bpm that increased to 150 bpm with exercise consistent with chronotropic competence.  No indication for pacemaker.  He does have a family history of PPM in his mother.  History of TIA: Patient reported that he was told  he had a history of TIA.  Okay to start aspirin 81 mg once daily.  Continue rosuvastatin 5 mg weekly.  HLD, unknown values: Continue rosuvastatin 5 mg weekly.  Goal LDL less than 70.       Medication Adjustments/Labs and Tests Ordered: Current medicines are reviewed at length with the patient today.  Concerns regarding medicines are outlined above.    Disposition:  Follow up  pending results based on the event monitor report and cardiology office note of his prior cardiologist that we obtain in the next couple of weeks.  Signed Lanika Colgate Verne Spurr, MD, 10/24/2023 3:49 PM    [facility] at [facility] [address] [place], [place], [place] [address]

## 2023-10-24 NOTE — Patient Instructions (Signed)
Medication Instructions:  Your physician recommends that you continue on your current medications as directed. Please refer to the Current Medication list given to you today.   Labwork: None  Testing/Procedures: None  Follow-Up: Your physician recommends that you schedule a follow-up appointment in: Pending Follow up  Any Other Special Instructions Will Be Listed Below (If Applicable).  Please contact our office in 2 weeks to see if we have received records for Cardiology office  Thank you for choosing [facility]!      If you need a refill on your cardiac medications before your next appointment, please call your pharmacy.

## 2023-10-27 DIAGNOSIS — M62838 Other muscle spasm: Secondary | ICD-10-CM | POA: Diagnosis not present

## 2023-10-27 DIAGNOSIS — R102 Pelvic and perineal pain: Secondary | ICD-10-CM | POA: Diagnosis not present

## 2023-10-27 DIAGNOSIS — K5900 Constipation, unspecified: Secondary | ICD-10-CM | POA: Diagnosis not present

## 2023-10-27 DIAGNOSIS — M6281 Muscle weakness (generalized): Secondary | ICD-10-CM | POA: Diagnosis not present

## 2023-11-07 ENCOUNTER — Telehealth: Payer: Self-pay | Admitting: Internal Medicine

## 2023-11-07 NOTE — Telephone Encounter (Signed)
 Patient is calling to find out if we have obtain his heart monitor results.  He stated he signed release forms at his last office visit.

## 2023-11-07 NOTE — Telephone Encounter (Signed)
 Left detailed message that we have not received records from [facility] as of yet. Form faxed on 11/01/2023.

## 2023-11-09 DIAGNOSIS — R102 Pelvic and perineal pain: Secondary | ICD-10-CM | POA: Diagnosis not present

## 2023-11-09 DIAGNOSIS — M6281 Muscle weakness (generalized): Secondary | ICD-10-CM | POA: Diagnosis not present

## 2023-11-09 DIAGNOSIS — M62838 Other muscle spasm: Secondary | ICD-10-CM | POA: Diagnosis not present

## 2023-11-09 DIAGNOSIS — K5900 Constipation, unspecified: Secondary | ICD-10-CM | POA: Diagnosis not present

## 2023-11-23 DIAGNOSIS — H59813 Chorioretinal scars after surgery for detachment, bilateral: Secondary | ICD-10-CM | POA: Diagnosis not present

## 2023-11-23 DIAGNOSIS — H35372 Puckering of macula, left eye: Secondary | ICD-10-CM | POA: Diagnosis not present

## 2023-11-23 DIAGNOSIS — H3582 Retinal ischemia: Secondary | ICD-10-CM | POA: Diagnosis not present

## 2023-11-24 ENCOUNTER — Telehealth: Payer: Self-pay

## 2023-11-24 DIAGNOSIS — K5900 Constipation, unspecified: Secondary | ICD-10-CM | POA: Diagnosis not present

## 2023-11-24 DIAGNOSIS — R102 Pelvic and perineal pain: Secondary | ICD-10-CM | POA: Diagnosis not present

## 2023-11-24 DIAGNOSIS — M6281 Muscle weakness (generalized): Secondary | ICD-10-CM | POA: Diagnosis not present

## 2023-11-24 DIAGNOSIS — M62838 Other muscle spasm: Secondary | ICD-10-CM | POA: Diagnosis not present

## 2023-11-24 NOTE — Telephone Encounter (Signed)
 Spoke with patient regarding records that has been requested for his continuing care. We have not received the event monitor report from [facility]. Advised him per Dr. Jenene Slicker we can send one out for him so we can continue managing care. He stated he would rather not and see if he could go back and see if they will give him them report. Advise him to let us know and gave him our fax number as well.

## 2023-12-06 DIAGNOSIS — R1011 Right upper quadrant pain: Secondary | ICD-10-CM | POA: Diagnosis not present

## 2023-12-06 DIAGNOSIS — C61 Malignant neoplasm of prostate: Secondary | ICD-10-CM | POA: Diagnosis not present

## 2023-12-06 DIAGNOSIS — K5730 Diverticulosis of large intestine without perforation or abscess without bleeding: Secondary | ICD-10-CM | POA: Diagnosis not present

## 2023-12-06 DIAGNOSIS — R1900 Intra-abdominal and pelvic swelling, mass and lump, unspecified site: Secondary | ICD-10-CM | POA: Diagnosis not present

## 2023-12-06 DIAGNOSIS — R1901 Right upper quadrant abdominal swelling, mass and lump: Secondary | ICD-10-CM | POA: Diagnosis not present

## 2023-12-14 DIAGNOSIS — C61 Malignant neoplasm of prostate: Secondary | ICD-10-CM | POA: Diagnosis not present

## 2023-12-29 DIAGNOSIS — R102 Pelvic and perineal pain: Secondary | ICD-10-CM | POA: Diagnosis not present

## 2023-12-29 DIAGNOSIS — M6281 Muscle weakness (generalized): Secondary | ICD-10-CM | POA: Diagnosis not present

## 2023-12-29 DIAGNOSIS — M62838 Other muscle spasm: Secondary | ICD-10-CM | POA: Diagnosis not present

## 2023-12-29 DIAGNOSIS — K5900 Constipation, unspecified: Secondary | ICD-10-CM | POA: Diagnosis not present

## 2024-01-25 DIAGNOSIS — N393 Stress incontinence (female) (male): Secondary | ICD-10-CM | POA: Diagnosis not present

## 2024-01-25 DIAGNOSIS — M6281 Muscle weakness (generalized): Secondary | ICD-10-CM | POA: Diagnosis not present

## 2024-01-25 DIAGNOSIS — M62838 Other muscle spasm: Secondary | ICD-10-CM | POA: Diagnosis not present

## 2024-01-25 DIAGNOSIS — R1901 Right upper quadrant abdominal swelling, mass and lump: Secondary | ICD-10-CM | POA: Diagnosis not present

## 2024-01-25 DIAGNOSIS — R102 Pelvic and perineal pain: Secondary | ICD-10-CM | POA: Diagnosis not present

## 2024-02-22 DIAGNOSIS — R102 Pelvic and perineal pain: Secondary | ICD-10-CM | POA: Diagnosis not present

## 2024-02-22 DIAGNOSIS — K5900 Constipation, unspecified: Secondary | ICD-10-CM | POA: Diagnosis not present

## 2024-02-22 DIAGNOSIS — M6281 Muscle weakness (generalized): Secondary | ICD-10-CM | POA: Diagnosis not present

## 2024-02-22 DIAGNOSIS — M62838 Other muscle spasm: Secondary | ICD-10-CM | POA: Diagnosis not present

## 2024-03-13 DIAGNOSIS — H401221 Low-tension glaucoma, left eye, mild stage: Secondary | ICD-10-CM | POA: Diagnosis not present

## 2024-03-13 DIAGNOSIS — H3582 Retinal ischemia: Secondary | ICD-10-CM | POA: Diagnosis not present

## 2024-03-13 DIAGNOSIS — H35372 Puckering of macula, left eye: Secondary | ICD-10-CM | POA: Diagnosis not present

## 2024-03-13 DIAGNOSIS — H59813 Chorioretinal scars after surgery for detachment, bilateral: Secondary | ICD-10-CM | POA: Diagnosis not present

## 2024-03-21 DIAGNOSIS — C61 Malignant neoplasm of prostate: Secondary | ICD-10-CM | POA: Diagnosis not present

## 2024-03-21 DIAGNOSIS — R102 Pelvic and perineal pain: Secondary | ICD-10-CM | POA: Diagnosis not present

## 2024-03-21 DIAGNOSIS — K5900 Constipation, unspecified: Secondary | ICD-10-CM | POA: Diagnosis not present

## 2024-03-21 DIAGNOSIS — M62838 Other muscle spasm: Secondary | ICD-10-CM | POA: Diagnosis not present

## 2024-03-21 DIAGNOSIS — M6281 Muscle weakness (generalized): Secondary | ICD-10-CM | POA: Diagnosis not present

## 2024-03-29 DIAGNOSIS — R5383 Other fatigue: Secondary | ICD-10-CM | POA: Diagnosis not present

## 2024-03-29 DIAGNOSIS — Z0000 Encounter for general adult medical examination without abnormal findings: Secondary | ICD-10-CM | POA: Diagnosis not present

## 2024-03-29 DIAGNOSIS — Z7189 Other specified counseling: Secondary | ICD-10-CM | POA: Diagnosis not present

## 2024-03-29 DIAGNOSIS — H5440 Blindness, one eye, unspecified eye: Secondary | ICD-10-CM | POA: Diagnosis not present

## 2024-03-29 DIAGNOSIS — Z1331 Encounter for screening for depression: Secondary | ICD-10-CM | POA: Diagnosis not present

## 2024-03-29 DIAGNOSIS — I480 Paroxysmal atrial fibrillation: Secondary | ICD-10-CM | POA: Diagnosis not present

## 2024-03-29 DIAGNOSIS — Z1339 Encounter for screening examination for other mental health and behavioral disorders: Secondary | ICD-10-CM | POA: Diagnosis not present

## 2024-03-29 DIAGNOSIS — Z299 Encounter for prophylactic measures, unspecified: Secondary | ICD-10-CM | POA: Diagnosis not present

## 2024-03-30 DIAGNOSIS — R5383 Other fatigue: Secondary | ICD-10-CM | POA: Diagnosis not present

## 2024-03-30 DIAGNOSIS — E7800 Pure hypercholesterolemia, unspecified: Secondary | ICD-10-CM | POA: Diagnosis not present

## 2024-03-30 DIAGNOSIS — Z79899 Other long term (current) drug therapy: Secondary | ICD-10-CM | POA: Diagnosis not present

## 2024-03-30 DIAGNOSIS — Z125 Encounter for screening for malignant neoplasm of prostate: Secondary | ICD-10-CM | POA: Diagnosis not present

## 2024-04-03 DIAGNOSIS — C61 Malignant neoplasm of prostate: Secondary | ICD-10-CM | POA: Diagnosis not present

## 2024-04-11 DIAGNOSIS — M6281 Muscle weakness (generalized): Secondary | ICD-10-CM | POA: Diagnosis not present

## 2024-04-11 DIAGNOSIS — H43813 Vitreous degeneration, bilateral: Secondary | ICD-10-CM | POA: Diagnosis not present

## 2024-04-11 DIAGNOSIS — M62838 Other muscle spasm: Secondary | ICD-10-CM | POA: Diagnosis not present

## 2024-04-11 DIAGNOSIS — H3582 Retinal ischemia: Secondary | ICD-10-CM | POA: Diagnosis not present

## 2024-04-11 DIAGNOSIS — H40023 Open angle with borderline findings, high risk, bilateral: Secondary | ICD-10-CM | POA: Diagnosis not present

## 2024-04-11 DIAGNOSIS — R102 Pelvic and perineal pain: Secondary | ICD-10-CM | POA: Diagnosis not present

## 2024-04-11 DIAGNOSIS — K5900 Constipation, unspecified: Secondary | ICD-10-CM | POA: Diagnosis not present

## 2024-04-11 DIAGNOSIS — H31093 Other chorioretinal scars, bilateral: Secondary | ICD-10-CM | POA: Diagnosis not present

## 2024-04-11 DIAGNOSIS — Z961 Presence of intraocular lens: Secondary | ICD-10-CM | POA: Diagnosis not present

## 2024-04-12 DIAGNOSIS — G4700 Insomnia, unspecified: Secondary | ICD-10-CM | POA: Diagnosis not present

## 2024-04-12 DIAGNOSIS — I480 Paroxysmal atrial fibrillation: Secondary | ICD-10-CM | POA: Diagnosis not present

## 2024-04-12 DIAGNOSIS — E7800 Pure hypercholesterolemia, unspecified: Secondary | ICD-10-CM | POA: Diagnosis not present

## 2024-04-12 DIAGNOSIS — H5440 Blindness, one eye, unspecified eye: Secondary | ICD-10-CM | POA: Diagnosis not present

## 2024-04-12 DIAGNOSIS — Z299 Encounter for prophylactic measures, unspecified: Secondary | ICD-10-CM | POA: Diagnosis not present

## 2024-04-12 DIAGNOSIS — G459 Transient cerebral ischemic attack, unspecified: Secondary | ICD-10-CM | POA: Diagnosis not present

## 2024-04-16 DIAGNOSIS — G459 Transient cerebral ischemic attack, unspecified: Secondary | ICD-10-CM | POA: Diagnosis not present

## 2024-04-19 DIAGNOSIS — R102 Pelvic and perineal pain: Secondary | ICD-10-CM | POA: Diagnosis not present

## 2024-04-19 DIAGNOSIS — M6281 Muscle weakness (generalized): Secondary | ICD-10-CM | POA: Diagnosis not present

## 2024-04-19 DIAGNOSIS — K5900 Constipation, unspecified: Secondary | ICD-10-CM | POA: Diagnosis not present

## 2024-04-19 DIAGNOSIS — M62838 Other muscle spasm: Secondary | ICD-10-CM | POA: Diagnosis not present

## 2024-04-19 DIAGNOSIS — N393 Stress incontinence (female) (male): Secondary | ICD-10-CM | POA: Diagnosis not present

## 2024-04-30 DIAGNOSIS — K5900 Constipation, unspecified: Secondary | ICD-10-CM | POA: Diagnosis not present

## 2024-04-30 DIAGNOSIS — M6281 Muscle weakness (generalized): Secondary | ICD-10-CM | POA: Diagnosis not present

## 2024-04-30 DIAGNOSIS — M62838 Other muscle spasm: Secondary | ICD-10-CM | POA: Diagnosis not present

## 2024-04-30 DIAGNOSIS — R102 Pelvic and perineal pain: Secondary | ICD-10-CM | POA: Diagnosis not present

## 2024-05-17 DIAGNOSIS — M6281 Muscle weakness (generalized): Secondary | ICD-10-CM | POA: Diagnosis not present

## 2024-05-17 DIAGNOSIS — M62838 Other muscle spasm: Secondary | ICD-10-CM | POA: Diagnosis not present

## 2024-05-17 DIAGNOSIS — K5900 Constipation, unspecified: Secondary | ICD-10-CM | POA: Diagnosis not present

## 2024-05-17 DIAGNOSIS — R102 Pelvic and perineal pain: Secondary | ICD-10-CM | POA: Diagnosis not present

## 2024-05-30 DIAGNOSIS — H59813 Chorioretinal scars after surgery for detachment, bilateral: Secondary | ICD-10-CM | POA: Diagnosis not present

## 2024-05-30 DIAGNOSIS — H35372 Puckering of macula, left eye: Secondary | ICD-10-CM | POA: Diagnosis not present

## 2024-05-30 DIAGNOSIS — H02839 Dermatochalasis of unspecified eye, unspecified eyelid: Secondary | ICD-10-CM | POA: Diagnosis not present

## 2024-05-30 DIAGNOSIS — H3582 Retinal ischemia: Secondary | ICD-10-CM | POA: Diagnosis not present

## 2024-05-30 DIAGNOSIS — H401221 Low-tension glaucoma, left eye, mild stage: Secondary | ICD-10-CM | POA: Diagnosis not present

## 2024-06-15 DIAGNOSIS — M62838 Other muscle spasm: Secondary | ICD-10-CM | POA: Diagnosis not present

## 2024-06-15 DIAGNOSIS — M6281 Muscle weakness (generalized): Secondary | ICD-10-CM | POA: Diagnosis not present

## 2024-06-15 DIAGNOSIS — R102 Pelvic and perineal pain unspecified side: Secondary | ICD-10-CM | POA: Diagnosis not present

## 2024-06-28 DIAGNOSIS — H40023 Open angle with borderline findings, high risk, bilateral: Secondary | ICD-10-CM | POA: Diagnosis not present

## 2024-06-28 DIAGNOSIS — H3582 Retinal ischemia: Secondary | ICD-10-CM | POA: Diagnosis not present

## 2024-06-28 DIAGNOSIS — Z961 Presence of intraocular lens: Secondary | ICD-10-CM | POA: Diagnosis not present

## 2024-07-13 DIAGNOSIS — G4700 Insomnia, unspecified: Secondary | ICD-10-CM | POA: Diagnosis not present

## 2024-07-13 DIAGNOSIS — R52 Pain, unspecified: Secondary | ICD-10-CM | POA: Diagnosis not present

## 2024-07-13 DIAGNOSIS — Z299 Encounter for prophylactic measures, unspecified: Secondary | ICD-10-CM | POA: Diagnosis not present

## 2024-07-13 DIAGNOSIS — Z23 Encounter for immunization: Secondary | ICD-10-CM | POA: Diagnosis not present

## 2024-07-13 DIAGNOSIS — I480 Paroxysmal atrial fibrillation: Secondary | ICD-10-CM | POA: Diagnosis not present

## 2024-07-13 DIAGNOSIS — G459 Transient cerebral ischemic attack, unspecified: Secondary | ICD-10-CM | POA: Diagnosis not present

## 2024-07-13 DIAGNOSIS — I10 Essential (primary) hypertension: Secondary | ICD-10-CM | POA: Diagnosis not present

## 2024-07-23 DIAGNOSIS — L814 Other melanin hyperpigmentation: Secondary | ICD-10-CM | POA: Diagnosis not present

## 2024-07-23 DIAGNOSIS — Z1283 Encounter for screening for malignant neoplasm of skin: Secondary | ICD-10-CM | POA: Diagnosis not present

## 2024-07-23 DIAGNOSIS — L570 Actinic keratosis: Secondary | ICD-10-CM | POA: Diagnosis not present

## 2024-07-23 DIAGNOSIS — D1801 Hemangioma of skin and subcutaneous tissue: Secondary | ICD-10-CM | POA: Diagnosis not present

## 2024-07-23 DIAGNOSIS — L821 Other seborrheic keratosis: Secondary | ICD-10-CM | POA: Diagnosis not present

## 2024-07-24 DIAGNOSIS — C61 Malignant neoplasm of prostate: Secondary | ICD-10-CM | POA: Diagnosis not present

## 2024-07-24 DIAGNOSIS — R102 Pelvic and perineal pain unspecified side: Secondary | ICD-10-CM | POA: Diagnosis not present

## 2024-07-24 DIAGNOSIS — M6281 Muscle weakness (generalized): Secondary | ICD-10-CM | POA: Diagnosis not present

## 2024-07-24 DIAGNOSIS — M62838 Other muscle spasm: Secondary | ICD-10-CM | POA: Diagnosis not present
